# Patient Record
Sex: Female | Born: 1970 | Race: Black or African American | Hispanic: No | Marital: Single | Smoking: Never smoker
Health system: Southern US, Community
[De-identification: ages and names within clinical notes are randomized; demographics above are authoritative.]

## PROBLEM LIST (undated history)

## (undated) DIAGNOSIS — E119 Type 2 diabetes mellitus without complications: Secondary | ICD-10-CM

## (undated) DIAGNOSIS — K760 Fatty (change of) liver, not elsewhere classified: Principal | ICD-10-CM

## (undated) DIAGNOSIS — K8020 Calculus of gallbladder without cholecystitis without obstruction: Secondary | ICD-10-CM

## (undated) DIAGNOSIS — Z8619 Personal history of other infectious and parasitic diseases: Secondary | ICD-10-CM

## (undated) DIAGNOSIS — I10 Essential (primary) hypertension: Secondary | ICD-10-CM

## (undated) HISTORY — DX: Fatty (change of) liver, not elsewhere classified: K76.0

## (undated) HISTORY — PX: REDUCTION MAMMAPLASTY: SUR839

## (undated) HISTORY — DX: Personal history of other infectious and parasitic diseases: Z86.19

## (undated) HISTORY — DX: Calculus of gallbladder without cholecystitis without obstruction: K80.20

## (undated) HISTORY — DX: Essential (primary) hypertension: I10

## (undated) HISTORY — PX: PARTIAL HYSTERECTOMY: SHX80

---

## 2013-02-11 ENCOUNTER — Emergency Department (HOSPITAL_BASED_OUTPATIENT_CLINIC_OR_DEPARTMENT_OTHER)
Admission: EM | Admit: 2013-02-11 | Discharge: 2013-02-11 | Disposition: A | Discharge status: Home / Self Care | Payer: BC Managed Care – PPO | Attending: Emergency Medicine | Admitting: Emergency Medicine

## 2013-02-11 ENCOUNTER — Encounter (HOSPITAL_BASED_OUTPATIENT_CLINIC_OR_DEPARTMENT_OTHER): Payer: Self-pay | Admitting: Emergency Medicine

## 2013-02-11 ENCOUNTER — Inpatient Hospital Stay (HOSPITAL_BASED_OUTPATIENT_CLINIC_OR_DEPARTMENT_OTHER): Admit: 2013-02-11 | Payer: BC Managed Care – PPO

## 2013-02-11 DIAGNOSIS — Z794 Long term (current) use of insulin: Secondary | ICD-10-CM | POA: Insufficient documentation

## 2013-02-11 DIAGNOSIS — N898 Other specified noninflammatory disorders of vagina: Secondary | ICD-10-CM | POA: Insufficient documentation

## 2013-02-11 DIAGNOSIS — N760 Acute vaginitis: Secondary | ICD-10-CM

## 2013-02-11 DIAGNOSIS — N949 Unspecified condition associated with female genital organs and menstrual cycle: Secondary | ICD-10-CM | POA: Insufficient documentation

## 2013-02-11 DIAGNOSIS — Z90711 Acquired absence of uterus with remaining cervical stump: Secondary | ICD-10-CM | POA: Insufficient documentation

## 2013-02-11 DIAGNOSIS — R102 Pelvic and perineal pain: Secondary | ICD-10-CM

## 2013-02-11 DIAGNOSIS — Z3202 Encounter for pregnancy test, result negative: Secondary | ICD-10-CM | POA: Insufficient documentation

## 2013-02-11 DIAGNOSIS — E119 Type 2 diabetes mellitus without complications: Secondary | ICD-10-CM | POA: Insufficient documentation

## 2013-02-11 HISTORY — DX: Type 2 diabetes mellitus without complications: E11.9

## 2013-02-11 LAB — COMPREHENSIVE METABOLIC PANEL
AST: 25 U/L (ref 0–37)
Albumin: 3.9 g/dL (ref 3.5–5.2)
Calcium: 10.1 mg/dL (ref 8.4–10.5)
Creatinine, Ser: 0.6 mg/dL (ref 0.50–1.10)
Total Protein: 8 g/dL (ref 6.0–8.3)

## 2013-02-11 LAB — URINALYSIS, ROUTINE W REFLEX MICROSCOPIC
Glucose, UA: NEGATIVE mg/dL
Ketones, ur: NEGATIVE mg/dL
Leukocytes, UA: NEGATIVE
pH: 6.5 (ref 5.0–8.0)

## 2013-02-11 LAB — WET PREP, GENITAL: Yeast Wet Prep HPF POC: NONE SEEN

## 2013-02-11 LAB — CBC
MCH: 31.8 pg (ref 26.0–34.0)
MCV: 93 fL (ref 78.0–100.0)
Platelets: 282 10*3/uL (ref 150–400)
RDW: 12.8 % (ref 11.5–15.5)
WBC: 11.2 10*3/uL — ABNORMAL HIGH (ref 4.0–10.5)

## 2013-02-11 MED ORDER — METRONIDAZOLE 500 MG PO TABS: 500.0000 mg | ORAL_TABLET | Freq: Two times a day (BID) | ORAL | Status: DC

## 2013-02-11 MED ORDER — IBUPROFEN 600 MG PO TABS: 600.0000 mg | ORAL_TABLET | Freq: Four times a day (QID) | ORAL | Status: DC | PRN

## 2013-02-11 NOTE — ED Notes (Signed)
Pelvic cart set up at bedside  

## 2013-02-11 NOTE — ED Notes (Signed)
Pt reports abd pain, worse on right side, but feels "down below, going across" - starting yesterday, yellowish vaginal discharge with strange odor, starting today. No fevers, no n/v.

## 2013-02-11 NOTE — ED Provider Notes (Signed)
History     CSN: [id number]  Arrival date & time 02/11/13  0022   First MD Initiated Contact with Patient 02/11/13 0148      Chief Complaint  Patient presents with  . Abdominal Pain  . Vaginal Discharge    (Consider location/radiation/quality/duration/timing/severity/associated sxs/prior treatment) HPI Pt presenting with concern for vaginal discharge and well as right sided pelvic pain.  Pt states the pain began yesterday, she noted the discharge today.  Pain has been constant, is sharp in nature.  Certain positions make pain worse. No dysuria, no fever/chills, no vomiting, has had normal appetite.  Denies change in bowel movement.  Discharge described as yellowish with a foul odor.  No rash.  She has not had any treatment for her symptoms.  There are no other associated systemic symptoms, there are no other alleviating or modifying factors.   Past Medical History  Diagnosis Date  . Diabetes mellitus without complication     Past Surgical History  Procedure Laterality Date  . Partial hysterectomy      No family history on file.  History  Substance Use Topics  . Smoking status: Not on file  . Smokeless tobacco: Not on file  . Alcohol Use: Yes     Comment: social    OB History   Grav Para Term Preterm Abortions TAB SAB Ect Mult Living                  Review of Systems ROS reviewed and all otherwise negative except for mentioned in HPI  Allergies  Review of patient's allergies indicates no known allergies.  Home Medications   Current Outpatient Rx  Name  Route  Sig  Dispense  Refill  . insulin glargine (LANTUS) 100 UNIT/ML injection   Subcutaneous   Inject 30 Units into the skin at bedtime.           BP 125/96  Pulse 79  Temp(Src) 98.2 F (36.8 C) (Oral)  Resp 16  Ht 5\' 1"  (1.549 m)  Wt 160 lb (72.576 kg)  BMI 30.25 kg/m2  SpO2 97% Vitals reviewed Physical Exam Physical Examination: General appearance - alert, well appearing, and in no  distress Mental status - alert, oriented to person, place, and time Eyes - no conjunctival injection, no scleral icterus Mouth - mucous membranes moist, pharynx normal without lesions Chest - clear to auscultation, no wheezes, rales or rhonchi, symmetric air entry Heart - normal rate, regular rhythm, normal S1, S2, no murmurs, rubs, clicks or gallops Abdomen - soft, nontender, nondistended, no masses or organomegaly Pelvic - normal external genitalia, vulva, vagina, cervical cuff, scant whitish discharge in vaginal vault, mild right adnexal tenderness, uterus surgically absent Extremities - peripheral pulses normal, no pedal edema, no clubbing or cyanosis Skin - normal coloration and turgor, no rashes  ED Course  Procedures (including critical care time)  Labs Reviewed  WET PREP, GENITAL - Abnormal; Notable for the following:    Clue Cells Wet Prep HPF POC FEW (*)    WBC, Wet Prep HPF POC RARE (*)    All other components within normal limits  URINALYSIS, ROUTINE W REFLEX MICROSCOPIC - Abnormal; Notable for the following:    APPearance CLOUDY (*)    All other components within normal limits  CBC - Abnormal; Notable for the following:    WBC 11.2 (*)    All other components within normal limits  COMPREHENSIVE METABOLIC PANEL - Abnormal; Notable for the following:    Glucose, Bld 148 (*)  ALT 43 (*)    Total Bilirubin 0.2 (*)    All other components within normal limits  GC/CHLAMYDIA PROBE AMP  PREGNANCY, URINE  LIPASE, BLOOD   No results found.   1. Pelvic pain       MDM  Pt presenting with right sided pelvic pain and vaginal discharge.  She is s/p hysterectomy but believes she still has both ovaries.  Pt with clue cells on wet prep- so given rx for flagyl to treat BV.  Pt scheduled for pelvic ultrasound- to return in a few hours once ultrasound capabilities are available at this facility.  I have low suspicion for ovarian torsion.  Abdomen is nontender, pain seems  localized to pelvic region, making appendicitis less likely.  Pt is agreeable with plan for return, she was given strict return precautions.  I do not think a CT scan will be helpful in this patient, if ultrasound is reassuring, she will arrange for f/u with her PMD or return to the ED with worsening symptoms.          Ethelda Chick, MD 02/11/13 [phone]

## 2013-02-15 ENCOUNTER — Other Ambulatory Visit (HOSPITAL_BASED_OUTPATIENT_CLINIC_OR_DEPARTMENT_OTHER): Payer: Self-pay | Admitting: Emergency Medicine

## 2013-02-15 DIAGNOSIS — R102 Pelvic and perineal pain: Secondary | ICD-10-CM

## 2013-02-21 ENCOUNTER — Ambulatory Visit: Payer: Self-pay | Admitting: Family

## 2013-02-28 ENCOUNTER — Ambulatory Visit (INDEPENDENT_AMBULATORY_CARE_PROVIDER_SITE_OTHER): Payer: BC Managed Care – PPO | Admitting: Family

## 2013-02-28 ENCOUNTER — Telehealth: Payer: Self-pay | Admitting: *Deleted

## 2013-02-28 ENCOUNTER — Encounter: Payer: Self-pay | Admitting: Family

## 2013-02-28 VITALS — BP 142/100 | HR 80 | Temp 97.8°F | Resp 16 | Ht 62.5 in | Wt 168.1 lb

## 2013-02-28 DIAGNOSIS — R059 Cough, unspecified: Secondary | ICD-10-CM

## 2013-02-28 DIAGNOSIS — J40 Bronchitis, not specified as acute or chronic: Secondary | ICD-10-CM

## 2013-02-28 DIAGNOSIS — E119 Type 2 diabetes mellitus without complications: Secondary | ICD-10-CM

## 2013-02-28 DIAGNOSIS — G609 Hereditary and idiopathic neuropathy, unspecified: Secondary | ICD-10-CM

## 2013-02-28 DIAGNOSIS — A6000 Herpesviral infection of urogenital system, unspecified: Secondary | ICD-10-CM

## 2013-02-28 DIAGNOSIS — R05 Cough: Secondary | ICD-10-CM

## 2013-02-28 DIAGNOSIS — Z23 Encounter for immunization: Secondary | ICD-10-CM

## 2013-02-28 DIAGNOSIS — E1165 Type 2 diabetes mellitus with hyperglycemia: Secondary | ICD-10-CM | POA: Insufficient documentation

## 2013-02-28 DIAGNOSIS — I10 Essential (primary) hypertension: Secondary | ICD-10-CM

## 2013-02-28 DIAGNOSIS — E785 Hyperlipidemia, unspecified: Secondary | ICD-10-CM

## 2013-02-28 DIAGNOSIS — G629 Polyneuropathy, unspecified: Secondary | ICD-10-CM | POA: Insufficient documentation

## 2013-02-28 MED ORDER — BLOOD GLUCOSE TEST VI STRP: ORAL_STRIP | Status: DC

## 2013-02-28 MED ORDER — VALACYCLOVIR HCL 1 G PO TABS: 1000.0000 mg | ORAL_TABLET | Freq: Every day | ORAL | Status: DC

## 2013-02-28 MED ORDER — LANCETS MISC: Status: DC

## 2013-02-28 MED ORDER — AZITHROMYCIN 250 MG PO TABS: ORAL_TABLET | ORAL | Status: DC

## 2013-02-28 NOTE — Assessment & Plan Note (Signed)
Will rx with zithromax for bronchitis.  If symptoms don't improve consider CXR and discontinuation of ACE.

## 2013-02-28 NOTE — Telephone Encounter (Signed)
Check sugar bid.

## 2013-02-28 NOTE — Assessment & Plan Note (Signed)
Will rx with zithromax.   

## 2013-02-28 NOTE — Telephone Encounter (Signed)
Rxs sent

## 2013-02-28 NOTE — Assessment & Plan Note (Signed)
She would like to try suppressive therapy with valtex. Will order.

## 2013-02-28 NOTE — Addendum Note (Signed)
Addended by: Mervin Kung A on: 02/28/2013 04:14 PM   Modules accepted: Orders

## 2013-02-28 NOTE — Assessment & Plan Note (Signed)
Per pt this is well controlled.  Obtain a1c, urine microalbumin, bmet.  Continue lantus. Handwritten rx for glucose meter provided. Will request old records.  Refer for DM eye exam.  Pneumovax today.

## 2013-02-28 NOTE — Progress Notes (Signed)
Subjective:    Patient ID: Breanna Avery, female    DOB: [birth year], 42 y.o.   MRN: [id number]  HPI  Breanna Avery is a 42 yr old female here today to establish care.  1) DM2-she is currently maintained on lantus 30 units HS. She was diagnosed 2 years ago.  She has been following with regional physicians on [place].  She reports that in the AM her sugar runs about 90.  HS sugars can go up to 140.  Last eye exam was may of last year.   2) HTN- Currently maintained on lisinopril.  Reports that she is on lisinopril x 6 mos. She is s/p hysterectomy.  3) Cough- reports that cough comes and goes.  Worse with deep breath.  She reports that his has been going on x 6 weeks.  She denies associated fever. Notes mild phlegm.  Feels bad.  She has not taken any otc meds.    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    Review of Systems  Constitutional: Negative for unexpected weight change.  HENT: Positive for rhinorrhea.   Eyes: Negative for visual disturbance.  Respiratory: Positive for cough.   Cardiovascular: Negative for leg swelling.  Gastrointestinal: Negative for nausea, vomiting and diarrhea.  Genitourinary: Negative for dysuria and frequency.  Musculoskeletal: Negative for arthralgias.       She reports some Leg soreness which she attributes to walking at work.   Skin:       Intermittent HSV rash  Neurological: Negative for headaches.  Hematological: Negative for adenopathy.  Psychiatric/Behavioral:       Denies depression/anxiety   Past Medical History  Diagnosis Date  . Diabetes mellitus without complication   . Hypertension   . History of herpes genitalis   . HTN (hypertension)     History   Social History  . Marital Status: Single    Spouse Name: N/A    Number of Children: 3  . Years of Education: N/A   Occupational History  .     Social History Main Topics  . Smoking status: Never Smoker   .  Smokeless tobacco: Never Used  . Alcohol Use: Yes     Comment: social  . Drug Use: Not on file  . Sexually Active: Not on file   Other Topics Concern  . 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 Partial hysterectomy      Family History  Problem Relation Age of Onset  . Hypertension Mother   . Diabetes Father   . Hypertension Sister   . Cancer Maternal Aunt     ovarian  . Cancer Maternal Uncle     lung  . Diabetes Maternal Grandmother   . Diabetes Maternal Grandfather   . Heart disease Neg Hx   . Diabetes Cousin     No Known Allergies  Current Outpatient Prescriptions on File Prior to Visit  Medication Sig Dispense Refill  . insulin glargine (LANTUS) 100 UNIT/ML injection Inject 30 Units into the skin at bedtime.      . metroNIDAZOLE (FLAGYL) 500 MG tablet Take 1 tablet (500 mg  total) by mouth 2 (two) times daily.  10 tablet  0  . ibuprofen (ADVIL,MOTRIN) 600 MG tablet Take 1 tablet (600 mg total) by mouth every 6 (six) hours as needed for pain.  30 tablet  0   No current facility-administered medications on file prior to visit.    BP 142/100  Pulse 80  Temp(Src) 97.8 F (36.6 C) (Oral)  Resp 16  Ht 5' 2.5" (1.588 m)  Wt 168 lb 1.9 oz (76.259 kg)  BMI 30.24 kg/m2  SpO2 99%       Objective:   Physical Exam  Constitutional: She is oriented to person, place, and time. She appears well-developed and well-nourished. No distress.  HENT:  Head: Normocephalic and atraumatic.  Cardiovascular: Normal rate and regular rhythm.   No murmur heard. Pulmonary/Chest: Effort normal and breath sounds normal. No respiratory distress. She has no wheezes. She has no rales. She exhibits no tenderness.  Musculoskeletal: She  exhibits no edema.  Lymphadenopathy:    She has no cervical adenopathy.  Neurological: She is alert and oriented to person, place, and time.  Skin: Skin is warm and dry. No rash noted. No erythema. No pallor.  Psychiatric: She has a normal mood and affect. Her behavior is normal. Judgment and thought content normal.          Assessment & Plan:

## 2013-02-28 NOTE — Assessment & Plan Note (Signed)
BP is up today.  Reports that she did not take lisinopril. Recommended that she resume med. Will repeat BP in 1 month.

## 2013-02-28 NOTE — Assessment & Plan Note (Signed)
Stable on gabapentin, continue same.  

## 2013-02-28 NOTE — Telephone Encounter (Signed)
Received call from [facility] requesting rx for test strips and lancets. Will send rx for generic strips and lancets as they are not sure which meter pt is going to obtain. Please advise how many times a day you would like pt to check sugars?

## 2013-02-28 NOTE — Patient Instructions (Addendum)
Please complete lab work prior to leaving. Call if cough worsens or if not improved in 2-3 days. Follow up in 1 month for a completed physical. Welcome to [facility]!

## 2013-02-28 NOTE — Assessment & Plan Note (Signed)
Notes some myalgia, not sure if this is related to statin. Check CK, recommended pt stop statin x 1 week and let us know how she feels off of the medication.

## 2013-03-01 LAB — HEPATIC FUNCTION PANEL
ALT: 52 U/L — ABNORMAL HIGH (ref 0–35)
AST: 38 U/L — ABNORMAL HIGH (ref 0–37)
Bilirubin, Direct: <0.1 mg/dL (ref 0.0–0.3)
Total Protein: 7.5 g/dL (ref 6.0–8.3)

## 2013-03-01 LAB — BASIC METABOLIC PANEL
BUN: 6 mg/dL (ref 6–23)
Calcium: 10 mg/dL (ref 8.4–10.5)
Glucose, Bld: 136 mg/dL — ABNORMAL HIGH (ref 70–99)
Sodium: 140 mEq/L (ref 135–145)

## 2013-03-01 LAB — LIPID PANEL
Cholesterol: 245 mg/dL — ABNORMAL HIGH (ref 0–200)
HDL: 27 mg/dL — ABNORMAL LOW (ref >39)
Total CHOL/HDL Ratio: 9.1 Ratio
Triglycerides: 212 mg/dL — ABNORMAL HIGH (ref <150)
VLDL: 42 mg/dL — ABNORMAL HIGH (ref 0–40)

## 2013-03-01 LAB — CK: Total CK: 162 U/L (ref 7–177)

## 2013-03-01 LAB — HEMOGLOBIN A1C
Hgb A1c MFr Bld: 8.1 % — ABNORMAL HIGH (ref <5.7)
Mean Plasma Glucose: 186 mg/dL — ABNORMAL HIGH (ref <117)

## 2013-03-04 ENCOUNTER — Telehealth: Payer: Self-pay | Admitting: Family

## 2013-03-04 MED ORDER — SITAGLIPTIN PHOSPHATE 100 MG PO TABS: 100.0000 mg | ORAL_TABLET | Freq: Every day | ORAL | Status: DC

## 2013-03-04 NOTE — Telephone Encounter (Signed)
Pls call pt and let her know A1C 8.1 above goal.  I would like her to continue lantus insulin, add januvia once daily (we may have some coupons).  Cholesterol is high.  Is she taking lipitor? If not, she should start.  If she is taking regularly, should be increased to 80mg  once daily #30, 2 refills. Liver test is elevated.  Likely due to fatty liver.  Recommend low fat/low cholesterol diet.

## 2013-03-07 ENCOUNTER — Telehealth: Payer: Self-pay | Admitting: Family

## 2013-03-07 NOTE — Telephone Encounter (Signed)
januvia 100 mg tablet take 1 tablet by mouth every day qty 30

## 2013-03-07 NOTE — Telephone Encounter (Signed)
Left message on voicemail to return my call.  

## 2013-03-07 NOTE — Telephone Encounter (Signed)
Notified pt and she voices understanding. 30 day free trial offer and copay discount card left at front desk for pt to pick up.

## 2013-03-07 NOTE — Telephone Encounter (Signed)
See 03/04/13 phone note.

## 2013-03-14 ENCOUNTER — Telehealth: Payer: Self-pay | Admitting: Family

## 2013-03-14 NOTE — Telephone Encounter (Signed)
Received medical records from [facility]  P: [phone] F: [phone]

## 2013-03-20 ENCOUNTER — Telehealth: Payer: Self-pay | Admitting: Family

## 2013-03-20 NOTE — Telephone Encounter (Signed)
Breanna Avery at [facility] [phone] says they will send the case to the insurance company.  We will receive a decision with 72 hours by fax

## 2013-03-24 MED ORDER — METFORMIN HCL 500 MG PO TABS: 500.0000 mg | ORAL_TABLET | Freq: Two times a day (BID) | ORAL | Status: DC

## 2013-03-24 NOTE — Telephone Encounter (Signed)
Received denial from [facility] for Januvia because pt has not tried and failed metformin. Please advise.

## 2013-03-24 NOTE — Telephone Encounter (Signed)
OK, instead of januvia, lets have her start metformin. Rx sent.

## 2013-03-27 NOTE — Telephone Encounter (Signed)
.  left message to have patient return my call.  

## 2013-03-28 ENCOUNTER — Telehealth: Payer: Self-pay | Admitting: *Deleted

## 2013-03-28 NOTE — Telephone Encounter (Signed)
Patient informed, understood & agreed/SLS  

## 2013-03-28 NOTE — Telephone Encounter (Signed)
LMOM with contact name and number for return call RE: scheduling appointment for A&E per provider instructions/SLS

## 2013-03-28 NOTE — Telephone Encounter (Signed)
She needs to be re-evaluated in the office please.

## 2013-03-28 NOTE — Telephone Encounter (Signed)
Pt reports that she has finished the ABX given at 05.20.14 OV for bronchitis about a week ago and is still experiencing coughing episodes that cause SOB & chest tightness, producing "yellowish" phlegm; was instructed to call office if no improvement with Zpack.  Pt would also like to inform you that she had a "spell yesterday w/faintness", she check her blood sugar & result was 40, today was 105 and she is asymptomatic/SLS Please advise.

## 2013-04-11 ENCOUNTER — Ambulatory Visit (INDEPENDENT_AMBULATORY_CARE_PROVIDER_SITE_OTHER): Payer: BC Managed Care – PPO | Admitting: Family

## 2013-04-11 ENCOUNTER — Telehealth: Payer: Self-pay | Admitting: Family

## 2013-04-11 ENCOUNTER — Other Ambulatory Visit: Payer: Self-pay | Admitting: Family

## 2013-04-11 ENCOUNTER — Encounter: Payer: Self-pay | Admitting: Family

## 2013-04-11 VITALS — BP 112/78 | HR 75 | Temp 97.8°F | Resp 16 | Ht 62.5 in | Wt 165.0 lb

## 2013-04-11 DIAGNOSIS — Z1231 Encounter for screening mammogram for malignant neoplasm of breast: Secondary | ICD-10-CM

## 2013-04-11 DIAGNOSIS — E785 Hyperlipidemia, unspecified: Secondary | ICD-10-CM

## 2013-04-11 DIAGNOSIS — R10816 Epigastric abdominal tenderness: Secondary | ICD-10-CM | POA: Insufficient documentation

## 2013-04-11 DIAGNOSIS — R1013 Epigastric pain: Secondary | ICD-10-CM

## 2013-04-11 DIAGNOSIS — Z23 Encounter for immunization: Secondary | ICD-10-CM

## 2013-04-11 DIAGNOSIS — A6000 Herpesviral infection of urogenital system, unspecified: Secondary | ICD-10-CM

## 2013-04-11 DIAGNOSIS — Z0000 Encounter for general adult medical examination without abnormal findings: Secondary | ICD-10-CM | POA: Insufficient documentation

## 2013-04-11 DIAGNOSIS — I10 Essential (primary) hypertension: Secondary | ICD-10-CM

## 2013-04-11 LAB — CBC WITH DIFFERENTIAL/PLATELET
Basophils Absolute: 0 10*3/uL (ref 0.0–0.1)
HCT: 35.2 % — ABNORMAL LOW (ref 36.0–46.0)
Lymphocytes Relative: 45 % (ref 12–46)
Lymphs Abs: 4.2 10*3/uL — ABNORMAL HIGH (ref 0.7–4.0)
Neutro Abs: 4.4 10*3/uL (ref 1.7–7.7)
Platelets: 338 10*3/uL (ref 150–400)
RBC: 3.95 MIL/uL (ref 3.87–5.11)
RDW: 14.2 % (ref 11.5–15.5)
WBC: 9.2 10*3/uL (ref 4.0–10.5)

## 2013-04-11 LAB — HEPATIC FUNCTION PANEL
Bilirubin, Direct: <0.1 mg/dL (ref 0.0–0.3)
Total Bilirubin: 0.4 mg/dL (ref 0.3–1.2)

## 2013-04-11 LAB — LIPASE: Lipase: 20 U/L (ref 0–75)

## 2013-04-11 MED ORDER — OMEPRAZOLE 40 MG PO CPDR: 40.0000 mg | DELAYED_RELEASE_CAPSULE | Freq: Every day | ORAL | Status: DC

## 2013-04-11 MED ORDER — PITAVASTATIN CALCIUM 4 MG PO TABS: 1.0000 | ORAL_TABLET | Freq: Every day | ORAL | Status: DC

## 2013-04-11 NOTE — Progress Notes (Signed)
Subjective:    Patient ID: Breanna Avery, female    DOB: May 28, 1971, 42 y.o.   MRN: [id number]  HPI  Patient presents today for complete physical.  Immunizations: due for Tdap Diet:reports healthy diet.   Exercise: reports walks 3 days a week for 25-30 minutes Pap Smear:  Unsure of last pap smear. Mammogram: due  Pt here for fasting physical. Doesn't remember last tetanus. Last mammogram 02/2012, thinks she had a pap smear last year. Has not had EKG within the last year.  HTN- reports that she has resumed her BP med.  Genital herpes- No outbreaks on valtrex.   Hyperlipidemia- reports that she stopped atorvastatin briefly and myalgias improved.   Now back on statin, only able to tolerate 20mg  of atorvastatin.  GI upset- reports some GERD symptoms requesting PPI.     Review of Systems  Constitutional: Negative for unexpected weight change.  HENT: Negative for congestion.   Respiratory:       Reports that she has been using inhaler which has been helping with breathing  Cardiovascular: Negative for chest pain.  Gastrointestinal:       Reports mild reflux symptoms  Genitourinary: Negative for dysuria and frequency.  Musculoskeletal: Negative for myalgias and arthralgias.  Skin: Negative for rash.  Neurological: Negative for headaches.  Hematological: Negative for adenopathy.  Psychiatric/Behavioral:       Denies depression/anxiety   Past Medical History  Diagnosis Date  . Diabetes mellitus without complication   . Hypertension   . History of herpes genitalis   . HTN (hypertension)     History   Social History  . Marital Status: Single    Spouse Name: N/A    Number of Children: 3  . Years of Education: N/A   Occupational History  .     Social History Main Topics  . Smoking status: Never Smoker   . Smokeless tobacco: Never Used  . Alcohol Use: Yes     Comment: social  . Drug Use: Not on file  . Sexually Active: Not on file   Other Topics Concern  . 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 Partial hysterectomy      Family History  Problem Relation Age of Onset  . Hypertension Mother   . Diabetes Father   . Hypertension Sister   . Cancer Maternal Aunt     ovarian  . Cancer Maternal Uncle     lung  . Diabetes Maternal Grandmother   . Diabetes Maternal Grandfather   . Heart disease Neg Hx   . Diabetes Cousin     No Known Allergies  Current Outpatient Prescriptions on File Prior to Visit  Medication Sig Dispense Refill  . aspirin EC 81 MG tablet Take 81 mg by mouth daily.      . B-D ULTRAFINE III SHORT PEN 31G X 8 MM MISC       . gabapentin (NEURONTIN) 300 MG capsule 300 mg. One caps by mouth every morning and 2 caps at bedtime.      . Glucose Blood (BLOOD GLUCOSE TEST STRIPS) STRP Use to check blood sugar twice a day. DX 250.00  100 each  2  . ibuprofen (ADVIL,MOTRIN) 600 MG tablet Take 1 tablet (600 mg total) by mouth every 6 (six) hours as needed for pain.  30 tablet  0  . insulin glargine (LANTUS) 100 UNIT/ML injection Inject 30 Units into the skin at bedtime.      . Lancets MISC Use to check blood sugar twice a day.  DX 250.00  100 each  2  . lisinopril (PRINIVIL,ZESTRIL) 10 MG tablet Take 10 mg by mouth daily.      . metFORMIN (GLUCOPHAGE) 500 MG tablet Take 1 tablet (500 mg total) by mouth 2 (two) times daily with a meal.  60 tablet  3  . valACYclovir (VALTREX) 1000 MG tablet Take 1 tablet (1,000 mg total) by mouth daily.  30 tablet  5   No current facility-administered medications on file prior to visit.    BP 112/78  Pulse 75  Temp(Src) 97.8 F (36.6 C) (Oral)  Resp 16  Ht 5' 2.5" (1.588 m)  Wt 165 lb (74.844 kg)  BMI 29.68 kg/m2  SpO2 99%         Objective:   Physical Exam  Physical Exam  Constitutional: She is oriented to person, place, and time. She appears well-developed and well-nourished. No distress.  HENT:  Head: Normocephalic and atraumatic.  Right Ear: Tympanic membrane and ear canal normal.  Left Ear: Tympanic membrane and ear canal normal.  Mouth/Throat: Oropharynx is clear and moist.  Eyes: Pupils are equal, round, and reactive to light. No scleral icterus.  Neck: Normal range of motion. No thyromegaly present.  Cardiovascular: Normal rate and regular rhythm.   No murmur heard. Pulmonary/Chest: Effort normal and breath sounds normal. No respiratory distress. He has no wheezes. She has no rales. She exhibits no tenderness.  Abdominal: Soft. Bowel sounds are normal. He exhibits no distension and no mass. There is no tenderness. There is no rebound and no guarding.  Musculoskeletal: She exhibits no edema.  Lymphadenopathy:    She has no cervical adenopathy.  Neurological: She is alert and oriented to person, place, and time. She has normal reflexes. She exhibits normal muscle tone. Coordination normal.  Skin: Skin is warm and dry.  Psychiatric: She has a normal mood and affect. Her behavior is normal. Judgment and thought content normal.  Breasts: Examined lying Right: Without masses, retractions, discharge or axillary adenopathy.  Left: Without masses, retractions, discharge or axillary adenopathy.  Inguinal/mons: Normal without inguinal adenopathy  External genitalia: Normal  BUS/Urethra/Skene's glands: Normal  Bladder: Normal  Vagina: Normal  Pelvic: uterus is surgically absent, no adnexal masses.          Assessment & Plan:        Assessment & Plan:

## 2013-04-11 NOTE — Patient Instructions (Addendum)
Please complete your lab work prior to leaving. Schedule mammogram on the first floor. Call if abdominal tenderness worsens or if it is not improved by the omeprazole.   Stop atorvastatin, start Livalo- call if muscle pains persist after you switch to Livalo.

## 2013-04-11 NOTE — Assessment & Plan Note (Signed)
Mild. Will give trial of PPI.  Obtain lipase, lft. Call if symptoms worsen or if not improved in 1-2 weeks.

## 2013-04-11 NOTE — Telephone Encounter (Signed)
Prior authorization initiated for livalo 4mg  tablets with [employer] at [facility].  They will advise decision with 24 hours  Case # [id number]

## 2013-04-11 NOTE — Assessment & Plan Note (Signed)
Continue healthy diet, exercise. Tdap today.  Mammogram today. Discussed BSE.

## 2013-04-11 NOTE — Assessment & Plan Note (Signed)
Switch atorvastatin to livalo to see is she is better able to tolerate.

## 2013-04-11 NOTE — Assessment & Plan Note (Signed)
No outbreaks since starting valtrex.

## 2013-04-11 NOTE — Assessment & Plan Note (Signed)
BP looks good now that she is back on lisinopril. Continue same.

## 2013-04-12 ENCOUNTER — Ambulatory Visit (HOSPITAL_BASED_OUTPATIENT_CLINIC_OR_DEPARTMENT_OTHER): Payer: BC Managed Care – PPO

## 2013-04-13 MED ORDER — ROSUVASTATIN CALCIUM 20 MG PO TABS: 20.0000 mg | ORAL_TABLET | Freq: Every day | ORAL | Status: DC

## 2013-04-13 NOTE — Telephone Encounter (Signed)
I would switch her to Crestor 20 mg daily. It is water soluble so she should tolerate better. Samples are on her desk to try. On prescription write that she failed Lipitor and simvastatin and that will help with prior auth. Disp #30 with 3 rf

## 2013-04-13 NOTE — Telephone Encounter (Signed)
Received denial from [facility] for Livalo due to "60 day trials of simvastatin, atorvastatin and at least crestor did not lower cholesterol enough". Appeal can be requested. Left message for pt to return my call.  Need to know any/all cholesterol medications tried and reason for failure.

## 2013-04-13 NOTE — Telephone Encounter (Signed)
Left detailed message on voicemail re: samples to pick up Monday. Call if she tolerates medication without side effects and we can send further refills. Samples given to pt.

## 2013-04-13 NOTE — Telephone Encounter (Signed)
Spoke with pt. She states that she is not able to take more than 20mg  atorvastatin due to myalgia. Pt has also tried simvastatin in the past. Reports that her previous doctor did not feel that it was controlling her cholesterol enough but pt reports that she was not compliant with the medication. Notes that she did not have myalgias while on simvastatin and is ok to try it again. Per ins. we can try alternative (simvastatin or Crestor) or send letter of appeal. Please advise.

## 2013-04-17 ENCOUNTER — Telehealth: Payer: Self-pay | Admitting: Family

## 2013-04-17 NOTE — Telephone Encounter (Signed)
Opened in error

## 2013-04-19 ENCOUNTER — Inpatient Hospital Stay (HOSPITAL_BASED_OUTPATIENT_CLINIC_OR_DEPARTMENT_OTHER): Admission: RE | Admit: 2013-04-19 | Payer: BC Managed Care – PPO | Source: Ambulatory Visit

## 2013-05-01 ENCOUNTER — Ambulatory Visit (HOSPITAL_BASED_OUTPATIENT_CLINIC_OR_DEPARTMENT_OTHER)
Admission: RE | Admit: 2013-05-01 | Discharge: 2013-05-01 | Disposition: A | Discharge status: Home / Self Care | Payer: BC Managed Care – PPO | Source: Ambulatory Visit | Attending: Family | Admitting: Family

## 2013-05-01 ENCOUNTER — Encounter: Payer: Self-pay | Admitting: Family

## 2013-05-01 ENCOUNTER — Ambulatory Visit (INDEPENDENT_AMBULATORY_CARE_PROVIDER_SITE_OTHER): Payer: BC Managed Care – PPO | Admitting: Family

## 2013-05-01 VITALS — BP 130/94 | HR 66 | Temp 98.1°F | Resp 16 | Ht 62.5 in | Wt 163.0 lb

## 2013-05-01 DIAGNOSIS — L918 Other hypertrophic disorders of the skin: Secondary | ICD-10-CM | POA: Insufficient documentation

## 2013-05-01 DIAGNOSIS — L909 Atrophic disorder of skin, unspecified: Secondary | ICD-10-CM

## 2013-05-01 DIAGNOSIS — Z1231 Encounter for screening mammogram for malignant neoplasm of breast: Secondary | ICD-10-CM | POA: Insufficient documentation

## 2013-05-01 NOTE — Patient Instructions (Addendum)
Please keep treament areas clean and dry x 24 hours. Call if you develop redness/swelling drainage. You may use tylenol as needed for pain today.

## 2013-05-01 NOTE — Assessment & Plan Note (Signed)
Procedure including risks/benefits explained to patient.  Questions were answered. After informed consent was obtained, sites were cleansed with betadine and then alcohol. 1% Lidocaine with epinephrine was injected under lesion and then skin tags were removed using blade. 7 were removed from neck and one from beneath breast.  Several sites required a small amount of cautery obtain hemostasis.  Pt tolerated procedure well.  Specimen sent for pathology review.  Pt instructed to keep the area dry for 24 hours and to contact us if he develops redness, drainage or swelling at the site.  Pt may use tylenol as needed for discomfort today.

## 2013-05-01 NOTE — Progress Notes (Signed)
  Subjective:    Patient ID: Breanna Avery, female    DOB: [birth year], 42 y.o.   MRN: [id number]  HPI  Breanna Avery is a 42 yr old female who presents today for removal of multiple skin tags from her neck.  She also has one beneath her right breast.    Review of Systems     Objective:   Physical Exam  Constitutional: She appears well-developed and well-nourished.  Skin:  Multiple skin tags noted anterior/posterior neck.  + skin tag beneath right breast.  Psychiatric: She has a normal mood and affect. Her behavior is normal. Judgment and thought content normal.          Assessment & Plan:

## 2013-06-03 ENCOUNTER — Other Ambulatory Visit: Payer: Self-pay | Admitting: Family

## 2013-07-17 ENCOUNTER — Ambulatory Visit (INDEPENDENT_AMBULATORY_CARE_PROVIDER_SITE_OTHER): Payer: BC Managed Care – PPO | Admitting: Family

## 2013-07-17 ENCOUNTER — Encounter: Payer: Self-pay | Admitting: Family

## 2013-07-17 VITALS — BP 142/88 | HR 67 | Temp 97.7°F | Resp 16 | Ht 62.5 in | Wt 167.1 lb

## 2013-07-17 DIAGNOSIS — E785 Hyperlipidemia, unspecified: Secondary | ICD-10-CM

## 2013-07-17 DIAGNOSIS — E119 Type 2 diabetes mellitus without complications: Secondary | ICD-10-CM

## 2013-07-17 DIAGNOSIS — Z23 Encounter for immunization: Secondary | ICD-10-CM

## 2013-07-17 DIAGNOSIS — A6000 Herpesviral infection of urogenital system, unspecified: Secondary | ICD-10-CM

## 2013-07-17 DIAGNOSIS — I10 Essential (primary) hypertension: Secondary | ICD-10-CM

## 2013-07-17 DIAGNOSIS — G629 Polyneuropathy, unspecified: Secondary | ICD-10-CM

## 2013-07-17 DIAGNOSIS — E1149 Type 2 diabetes mellitus with other diabetic neurological complication: Secondary | ICD-10-CM

## 2013-07-17 DIAGNOSIS — G609 Hereditary and idiopathic neuropathy, unspecified: Secondary | ICD-10-CM

## 2013-07-17 LAB — BASIC METABOLIC PANEL WITH GFR
Calcium: 9.7 mg/dL (ref 8.4–10.5)
Chloride: 101 mEq/L (ref 96–112)
Creat: 0.68 mg/dL (ref 0.50–1.10)
GFR, Est Non African American: >89 mL/min

## 2013-07-17 LAB — LIPID PANEL
Cholesterol: 202 mg/dL — ABNORMAL HIGH (ref 0–200)
LDL Cholesterol: 120 mg/dL — ABNORMAL HIGH (ref 0–99)
Total CHOL/HDL Ratio: 6.7 Ratio
Triglycerides: 260 mg/dL — ABNORMAL HIGH (ref <150)

## 2013-07-17 LAB — HEMOGLOBIN A1C: Hgb A1c MFr Bld: 8.6 % — ABNORMAL HIGH (ref <5.7)

## 2013-07-17 MED ORDER — INSULIN GLARGINE 100 UNIT/ML SOLOSTAR PEN: PEN_INJECTOR | SUBCUTANEOUS | Status: DC

## 2013-07-17 NOTE — Assessment & Plan Note (Signed)
Reports small outbreak 2 weeks ago when patient missed a few doses.  Patient resumed medication and has had no outbreaks.  Continue Valtrex.

## 2013-07-17 NOTE — Progress Notes (Signed)
Subjective:    Patient ID: Breanna Avery, female    DOB: [birth year], 42 y.o.   MRN: [id number]  HPI Breanna Avery is a 42 y/o female who presents today for three month follow up of multiple problems.  1) Diabetes: Doesn't take Metformin routinely because she reports it drops her blood sugar (patient feels shakey) and causes diarrhea. Patient has been feeling this way since she began Metformin.  Taking Lantus daily at bedtime. Patient checking sugars at home reports they are 80 to 90. Last eye exam unknown.  2) Hypertension: Taking Lisinopril, denies dizziness, headaches, chest pain.  3) GI Upset: Taking Prilosec, denies epigastric irritation, continues to take Prilosec  4) Neuropathy: Taking Gabapentin, denies worsening of symptoms  5) Hyperlipidemia: Started on Lipitor reported muscle pain on 40mg , patient was encouraged to try samples of Crestor, patient never received message and has been taking 1/2 tablet of Lipitor without myalgias.  6) Genital Herpes: Small leison 2 weeks ago, patient missed a couple days of Valtrex, now back on Valtrex and lesion is gone.  7) Diet and Exercise:  Patient reports she eats fast food and fried and unhealthy foods. Denies exercise.    Review of Systems  Constitutional: Negative for activity change and fatigue.  HENT: Negative for ear pain, congestion and sore throat.   Respiratory: Negative for shortness of breath.   Cardiovascular: Negative for chest pain.  Gastrointestinal: Negative for vomiting, abdominal pain and diarrhea.  Endocrine: Negative for polydipsia, polyphagia and polyuria.  Genitourinary: Negative for dysuria.  Musculoskeletal: Negative for arthralgias.       Patient denies arthralgias since taking 1/2 tablet of Lipitor.   Skin: Negative for rash.  Neurological: Negative for headaches.  Psychiatric/Behavioral: The patient is nervous/anxious.        Patient reports increased stress at work and school over past 2 weeks.     Past  Medical History  Diagnosis Date  . Diabetes mellitus without complication   . Hypertension   . History of herpes genitalis   . HTN (hypertension)     History   Social History  . Marital Status: Single    Spouse Name: N/A    Number of Children: 3  . Years of Education: N/A   Occupational History  .     Social History Main Topics  . Smoking status: Never Smoker   . Smokeless tobacco: Never Used  . Alcohol Use: Yes     Comment: social  . Drug Use: Not on file  . Sexual Activity: Not on file   Other Topics Concern  . 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 Partial hysterectomy      Family History  Problem Relation Age of Onset  . Hypertension Mother   . Diabetes Father   . Hypertension Sister   . Cancer Maternal Aunt     ovarian  . Cancer Maternal Uncle     lung  . Diabetes Maternal Grandmother   . Diabetes Maternal Grandfather   . Heart disease Neg Hx   . Diabetes Cousin     No Known Allergies  Current Outpatient Prescriptions on File Prior to Visit  Medication Sig Dispense  Refill  . ACCU-CHEK FASTCLIX LANCETS MISC USE TO CHECK BLOOD SUGAR TWICE DAILY  102 each  1  . aspirin EC 81 MG tablet Take 81 mg by mouth daily.      . B-D ULTRAFINE III SHORT PEN 31G X 8 MM MISC       . gabapentin (NEURONTIN) 300 MG capsule 300 mg. One caps by mouth every morning and 2 caps at bedtime.      . Glucose Blood (BLOOD GLUCOSE TEST STRIPS) STRP Use to check blood sugar twice a day. DX 250.00  100 each  2  . lisinopril (PRINIVIL,ZESTRIL) 10 MG tablet Take 10 mg by mouth daily.      . metFORMIN (GLUCOPHAGE) 500 MG tablet Take 1 tablet (500 mg total) by mouth 2 (two) times daily with a meal.  60 tablet  3  . omeprazole  (PRILOSEC) 40 MG capsule Take 1 capsule (40 mg total) by mouth daily.  30 capsule  3  . rosuvastatin (CRESTOR) 20 MG tablet Take 1 tablet (20 mg total) by mouth daily. LOT [id number]  Exp 05/2015  28 tablet  0  . valACYclovir (VALTREX) 1000 MG tablet Take 1 tablet (1,000 mg total) by mouth daily.  30 tablet  5  . zolpidem (AMBIEN) 5 MG tablet Take 5 mg by mouth at bedtime as needed.       No current facility-administered medications on file prior to visit.    BP 142/88  Pulse 67  Temp(Src) 97.7 F (36.5 C) (Oral)  Resp 16  Ht 5' 2.5" (1.588 m)  Wt 167 lb 1.3 oz (75.787 kg)  BMI 30.05 kg/m2  SpO2 99%        Objective:   Physical Exam  Constitutional: She is oriented to person, place, and time. She appears well-nourished.  Neck: Neck supple.  Cardiovascular: Normal rate, regular rhythm and normal heart sounds.   Pulmonary/Chest: Breath sounds normal. No respiratory distress. She has no wheezes.  Abdominal: Soft. Bowel sounds are normal. There is no tenderness.  Musculoskeletal: She exhibits no tenderness.  Lymphadenopathy:    She has no cervical adenopathy.  Neurological: She is alert and oriented to person, place, and time.  Skin: Skin is warm and dry.  Psychiatric: She has a normal mood and affect. Her behavior is normal.          Assessment & Plan:

## 2013-07-17 NOTE — Assessment & Plan Note (Addendum)
Patient reports "shakey" feeling while taking Metformin and has not been taking routinely. Patient reports home blood sugars are running about 80 to 90.   D/C Metformin, continue Lantus. Diabetic foot exam negative. Counseled on diet and exercise. Check A1C, urine microalbumin, refer for dm eye exam.

## 2013-07-17 NOTE — Patient Instructions (Addendum)
Please complete your lab work prior to leaving. You will be contacted about your referral to the eye doctor. Follow up in 3 months.

## 2013-07-17 NOTE — Progress Notes (Signed)
  Subjective:    Patient ID: Breanna Avery, female    DOB: 06/24/1971, 42 y.o.   MRN: [id number]  HPI    Review of Systems     Objective:   Physical Exam        Assessment & Plan:  I have seen and examined Breanna Avery and agree with Breanna Avery assessment and plan.

## 2013-07-17 NOTE — Assessment & Plan Note (Signed)
Denies problems.  Continue Gabapentin.  Diabetic foot exam negative.

## 2013-07-17 NOTE — Assessment & Plan Note (Signed)
BP 142/88 in office today.  Continue taking Lisinopril.

## 2013-07-18 LAB — MICROALBUMIN / CREATININE URINE RATIO
Creatinine, Urine: 151.1 mg/dL
Microalb Creat Ratio: 5.2 mg/g (ref 0.0–30.0)
Microalb, Ur: 0.79 mg/dL (ref 0.00–1.89)

## 2013-07-21 ENCOUNTER — Telehealth: Payer: Self-pay | Admitting: Family

## 2013-07-21 MED ORDER — SITAGLIPTIN PHOSPHATE 100 MG PO TABS: 100.0000 mg | ORAL_TABLET | Freq: Every day | ORAL | Status: DC

## 2013-07-21 MED ORDER — EZETIMIBE 10 MG PO TABS: 10.0000 mg | ORAL_TABLET | Freq: Every day | ORAL | Status: DC

## 2013-07-21 NOTE — Telephone Encounter (Signed)
Please let pt know that her sugar is uncontrolled.  I would like her to continue lantus and add [place] once daily. Cholesterol is above goal.  Make sure she is taking crestor every night. Add zetia to help with cholesterol.

## 2013-07-24 NOTE — Telephone Encounter (Signed)
Pt also left message on voicemail that she was not feeling good and BS has been 400 for the last 3 days. Attempted to reach pt and left message to return my call.

## 2013-07-25 NOTE — Telephone Encounter (Signed)
Notified pt and she voices understanding. Pt states FBS today was 300. Advised her to proceed with recommendations below and let us know if BS is not improving over the next few days. Pt is agreeable to proceed with zetia. States she has not been on Crestor as she was still taking 1/2 tablet lipitor daily. Confirms that she has missed a couple days between doses of lipitor and asks that we just send Rx of Crestor to replace lipitor as she is not able to tolerate stronger dose. Is it ok to send rx of Crestor 20mg  daily and D/C lipitor?

## 2013-07-26 ENCOUNTER — Telehealth: Payer: Self-pay | Admitting: *Deleted

## 2013-07-26 NOTE — Telephone Encounter (Signed)
OK to stop lipitor and start crestor.  Call if muscle pain on crestor.

## 2013-07-26 NOTE — Telephone Encounter (Signed)
Received fax from [facility] pharmacy stating that "plan does not cover Januvia. Per Rx benefit plan alternative medications include: _________ [place] line]"; faxed request back to pharmacy & asked them to complete the blank for the plan alternative medications?/SLS

## 2013-07-31 MED ORDER — ROSUVASTATIN CALCIUM 20 MG PO TABS: 20.0000 mg | ORAL_TABLET | Freq: Every day | ORAL | Status: DC

## 2013-07-31 MED ORDER — INSULIN ASPART 100 UNIT/ML FLEXPEN: PEN_INJECTOR | SUBCUTANEOUS | Status: DC

## 2013-07-31 MED ORDER — SITAGLIPTIN PHOSPHATE 100 MG PO TABS: 100.0000 mg | ORAL_TABLET | Freq: Every day | ORAL | Status: DC

## 2013-07-31 NOTE — Telephone Encounter (Signed)
Spoke with Trula Ore at [facility] [phone]). She states Januvia is not covered on her plan and we will not be able to proceed with prior auth. She stated there were no preferred alternatives listed.  Was able to obtain authorization for Zetia from 07/31/13 through 07/31/14.  Pt left message that blood sugars are still running in the 300-400 range. Please advise.   Received fax from pharmacy on Friday that pt's insurance will not cover zetia or [place]. Left detailed message on pt's cell# that I will leave samples of januvia at the front desk for her to take while we try to obtain prior auth. And to let me know if she will be unable to pick up [place]. Have been unsuccessful at obtaining P.A.s due to long wait times with insurance. 2 boxes left at front desk.

## 2013-07-31 NOTE — Telephone Encounter (Signed)
Please call pt and advise her that instead of adding [place]- will add sliding scale novolog meal coverage TID AC meals. Continue lantus.  Rx has been sent to pharmacy.

## 2013-07-31 NOTE — Telephone Encounter (Signed)
Attempted to reach pt, left detailed message re: below instructions. Rx for novolog printed and was faxed to the pharmacy. Sent Crestor Rx previously and advised pt to call if any questions.

## 2013-08-04 ENCOUNTER — Ambulatory Visit: Payer: BC Managed Care – PPO | Admitting: Family

## 2013-08-04 DIAGNOSIS — Z0289 Encounter for other administrative examinations: Secondary | ICD-10-CM

## 2013-08-18 ENCOUNTER — Ambulatory Visit: Payer: BC Managed Care – PPO | Admitting: Family

## 2013-08-25 ENCOUNTER — Encounter: Payer: Self-pay | Admitting: Family

## 2013-08-25 ENCOUNTER — Ambulatory Visit (HOSPITAL_BASED_OUTPATIENT_CLINIC_OR_DEPARTMENT_OTHER)
Admission: RE | Admit: 2013-08-25 | Discharge: 2013-08-25 | Disposition: A | Discharge status: Home / Self Care | Payer: BC Managed Care – PPO | Source: Ambulatory Visit | Attending: Family | Admitting: Family

## 2013-08-25 ENCOUNTER — Ambulatory Visit (INDEPENDENT_AMBULATORY_CARE_PROVIDER_SITE_OTHER): Payer: BC Managed Care – PPO | Admitting: Family

## 2013-08-25 VITALS — BP 124/80 | HR 81 | Temp 98.4°F | Resp 16 | Ht 62.5 in | Wt 164.0 lb

## 2013-08-25 DIAGNOSIS — M25579 Pain in unspecified ankle and joints of unspecified foot: Secondary | ICD-10-CM | POA: Insufficient documentation

## 2013-08-25 DIAGNOSIS — M545 Low back pain, unspecified: Secondary | ICD-10-CM

## 2013-08-25 DIAGNOSIS — M25571 Pain in right ankle and joints of right foot: Secondary | ICD-10-CM

## 2013-08-25 DIAGNOSIS — E119 Type 2 diabetes mellitus without complications: Secondary | ICD-10-CM

## 2013-08-25 DIAGNOSIS — W19XXXA Unspecified fall, initial encounter: Secondary | ICD-10-CM | POA: Insufficient documentation

## 2013-08-25 DIAGNOSIS — M79604 Pain in right leg: Secondary | ICD-10-CM

## 2013-08-25 MED ORDER — MELOXICAM 7.5 MG PO TABS: 7.5000 mg | ORAL_TABLET | Freq: Every day | ORAL | Status: DC

## 2013-08-25 NOTE — Patient Instructions (Signed)
Please complete back and ankle xray on the first floor. Follow up in 2 month so we can keep track of your diabetes control.

## 2013-08-25 NOTE — Assessment & Plan Note (Signed)
Likely sprain, right foot.  X ray negative. Trial of meloxicam.

## 2013-08-25 NOTE — Assessment & Plan Note (Signed)
Improving, continue lantus, novolog sliding scale. Advised pt to let us know if she has any further hypoglycemia.

## 2013-08-25 NOTE — Assessment & Plan Note (Addendum)
Trial of meloxicam, x ray to evaluate L spine- normal.  Consider MRI if symptoms worsen or do not improve.

## 2013-08-25 NOTE — Progress Notes (Signed)
Subjective:    Patient ID: Breanna Avery, female    DOB: 11-02-1970, 42 y.o.   MRN: [id number]  HPI  Breanna Avery is a 42 yr old female who presents today for follow up.  DM2- Since she has been on novolog- the highest she has seen is 160.  She had one reading of 50.  This was AC breakfast.  She continues lantus.  She is off metformin. She is using novolog on a sliding scale- generally using 4-5 units.   R leg pain- reports "shooting pains" down the right leg.  She continues neurontin.  Notes mild low back pain.  Has tingling which extends down to the toes.  Was intermittent but becoming more frequent.  States she has fallen 2 x last month and once this month. Leg just gave out on her. Happened at work.  She reports that she has also had right ankle pain which she attributes to injury from her fall.    She is a Scientist, clinical (histocompatibility and immunogenetics) at [employer].  Reports that due to falls and recent decreased stamina, she has been unable to work full time.  Has dropped to "PRN".  She is starting disability application. Initially denied in 2012 and attorney has sent [place] additional paperwork to complete.      Review of Systems    see HPI  Past Medical History  Diagnosis Date  . Diabetes mellitus without complication   . Hypertension   . History of herpes genitalis   . HTN (hypertension)     History   Social History  . Marital Status: Single    Spouse Name: N/A    Number of Children: 3  . Years of Education: N/A   Occupational History  .     Social History Main Topics  . Smoking status: Never Smoker   . Smokeless tobacco: Never Used  . Alcohol Use: Yes     Comment: social  . Drug Use: Not on file  . Sexual Activity: Not on file   Other Topics Concern  . 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 Partial hysterectomy      Family History  Problem Relation Age of Onset  . Hypertension Mother   . Diabetes Father   . Hypertension Sister   . Cancer Maternal Aunt     ovarian  . Cancer Maternal Uncle     lung  . Diabetes Maternal Grandmother   . Diabetes Maternal Grandfather   . Heart disease Neg Hx   . Diabetes Cousin     No Known Allergies  Current Outpatient Prescriptions on File Prior to Visit  Medication Sig Dispense Refill  . ACCU-CHEK FASTCLIX LANCETS MISC USE TO CHECK BLOOD SUGAR TWICE DAILY  102 each  1  . aspirin EC 81 MG tablet Take 81 mg by mouth daily.      . B-D ULTRAFINE III SHORT PEN 31G X 8 MM MISC       . ezetimibe (ZETIA) 10 MG tablet Take 1 tablet (10 mg total) by mouth daily.  30 tablet  3  . gabapentin (NEURONTIN) 300 MG capsule 300 mg. One caps by mouth every morning and  2 caps at bedtime.      . Glucose Blood (BLOOD GLUCOSE TEST STRIPS) STRP Use to check blood sugar twice a day. DX 250.00  100 each  2  . insulin aspart (NOVOLOG FLEXPEN) 100 UNIT/ML SOPN FlexPen sliding scale- check sugar and inject 3 times daily before meals as below:  <150-   Zero units 150-200 2 units 201-250 4 units 251-300 6 units 301-350 8 units 351-400 10 units >400             12 units and contact us.  3 pen  0  . Insulin Glargine (LANTUS SOLOSTAR) 100 UNIT/ML SOPN Inject 30 units SQ QHS  5 pen  5  . lisinopril (PRINIVIL,ZESTRIL) 10 MG tablet Take 10 mg by mouth daily.      Marland Kitchen omeprazole (PRILOSEC) 40 MG capsule Take 1 capsule (40 mg total) by mouth daily.  30 capsule  3  . rosuvastatin (CRESTOR) 20 MG tablet Take 1 tablet (20 mg total) by mouth daily.  30 tablet  3  . valACYclovir (VALTREX) 1000 MG tablet Take 1 tablet (1,000 mg total) by mouth daily.  30 tablet  5  . zolpidem (AMBIEN) 5 MG tablet Take 5 mg by mouth at bedtime as needed.       No current facility-administered medications on file prior to visit.     BP 124/80  Pulse 81  Temp(Src) 98.4 F (36.9 C) (Oral)  Resp 16  Ht 5' 2.5" (1.588 m)  Wt 164 lb (74.39 kg)  BMI 29.50 kg/m2  SpO2 99%    Objective:   Physical Exam  Constitutional: She is oriented to person, place, and time. She appears well-developed and well-nourished. No distress.  HENT:  Head: Normocephalic and atraumatic.  Cardiovascular: Normal rate and regular rhythm.   No murmur heard. Pulmonary/Chest: Effort normal and breath sounds normal. No respiratory distress. She has no wheezes. She has no rales. She exhibits no tenderness.  Musculoskeletal: She exhibits no edema.  Bilateral LE strength is 5/5.   Lymphadenopathy:    She has no cervical adenopathy.  Neurological: She is alert and oriented to person, place, and time.  Psychiatric: She has a normal mood and affect. Her behavior is normal. Judgment and thought content normal.          Assessment & Plan:

## 2013-09-11 ENCOUNTER — Telehealth: Payer: Self-pay | Admitting: *Deleted

## 2013-09-11 NOTE — Telephone Encounter (Signed)
Pt called to check on the status of her physical residual functional capacity questionnaire. Advised her Provider may have additional questions and may contact her this evening.  Pt states it is ok to leave detailed message on her voicemail re: questions.

## 2013-09-12 NOTE — Telephone Encounter (Signed)
Spoke to pt. She reports she is seeking disability for diabetes, back pain etc.  Advised pt that I would fill out her form but that I do not think that her diabetes diagnosis should preclude her from working. Also advised pt that spine imaging is normal.

## 2013-09-13 ENCOUNTER — Telehealth: Payer: Self-pay | Admitting: Family

## 2013-09-13 NOTE — Telephone Encounter (Signed)
Notified pt, she states she still has intermittent tingling in her right leg.  States she is unable to make any appts at this time as her insurance has run out and will have a new plan after the first of the year.

## 2013-09-13 NOTE — Telephone Encounter (Signed)
Form completed and left at front desk.

## 2013-09-13 NOTE — Telephone Encounter (Signed)
Pls call pt and let her know that I have completed her disability form.  If she is still having right leg tingling, I think she should be evaluated by neurology.

## 2013-10-16 ENCOUNTER — Ambulatory Visit: Payer: BC Managed Care – PPO | Admitting: Family

## 2013-10-23 ENCOUNTER — Ambulatory Visit (INDEPENDENT_AMBULATORY_CARE_PROVIDER_SITE_OTHER): Payer: 59 | Admitting: Family

## 2013-10-23 ENCOUNTER — Encounter: Payer: Self-pay | Admitting: Family

## 2013-10-23 VITALS — BP 116/82 | HR 91 | Temp 97.8°F | Resp 16 | Ht 62.5 in | Wt 164.1 lb

## 2013-10-23 DIAGNOSIS — M545 Low back pain, unspecified: Secondary | ICD-10-CM

## 2013-10-23 DIAGNOSIS — M79604 Pain in right leg: Secondary | ICD-10-CM

## 2013-10-23 DIAGNOSIS — G609 Hereditary and idiopathic neuropathy, unspecified: Secondary | ICD-10-CM

## 2013-10-23 DIAGNOSIS — E119 Type 2 diabetes mellitus without complications: Secondary | ICD-10-CM

## 2013-10-23 DIAGNOSIS — E785 Hyperlipidemia, unspecified: Secondary | ICD-10-CM

## 2013-10-23 DIAGNOSIS — G629 Polyneuropathy, unspecified: Secondary | ICD-10-CM

## 2013-10-23 DIAGNOSIS — R454 Irritability and anger: Secondary | ICD-10-CM

## 2013-10-23 LAB — BASIC METABOLIC PANEL
BUN: 12 mg/dL (ref 6–23)
CALCIUM: 10.3 mg/dL (ref 8.4–10.5)
CO2: 28 mEq/L (ref 19–32)
Chloride: 96 mEq/L (ref 96–112)
Creat: 0.74 mg/dL (ref 0.50–1.10)
Glucose, Bld: 153 mg/dL — ABNORMAL HIGH (ref 70–99)
POTASSIUM: 4.4 meq/L (ref 3.5–5.3)
SODIUM: 138 meq/L (ref 135–145)

## 2013-10-23 LAB — LIPID PANEL
CHOL/HDL RATIO: 8.3 ratio
Cholesterol: 292 mg/dL — ABNORMAL HIGH (ref 0–200)
HDL: 35 mg/dL — AB (ref >39)
LDL CALC: 203 mg/dL — AB (ref 0–99)
Triglycerides: 272 mg/dL — ABNORMAL HIGH (ref <150)
VLDL: 54 mg/dL — AB (ref 0–40)

## 2013-10-23 LAB — HEMOGLOBIN A1C
Hgb A1c MFr Bld: 10.2 % — ABNORMAL HIGH (ref <5.7)
MEAN PLASMA GLUCOSE: 246 mg/dL — AB (ref <117)

## 2013-10-23 NOTE — Assessment & Plan Note (Signed)
Clinically improved on gabapentin.

## 2013-10-23 NOTE — Progress Notes (Signed)
Pre visit review using our clinic review tool, if applicable. No additional management support is needed unless otherwise documented below in the visit note. 

## 2013-10-23 NOTE — Assessment & Plan Note (Addendum)
No improvement. Obtain A1C.  If above goal, plan to increase Lantus by 1 unit daily until fasting blood sugar reach 110, continue Novolog per sliding sca;e.   Patient to schedule eye exam with optometrist. Obtain BMET and HgA1C today.  Follow up in three months.

## 2013-10-23 NOTE — Patient Instructions (Signed)
Please schedule and eye exam at your earliest convenience. Complete your lab work prior to leaving. Follow up in 3 months.

## 2013-10-23 NOTE — Assessment & Plan Note (Signed)
Discussed adding exercise for stress relief.  Will monitor.

## 2013-10-23 NOTE — Progress Notes (Addendum)
   Subjective:    Patient ID: Breanna Avery, female    DOB: 07-26-1971, 43 y.o.   MRN: [id number]  HPI [name] is a 43 year old female who presents today for follow up.  Diabetes- HbA1c 8.6 last visit. Reports she's checking blood sugars three times daily before meals, reports her readings are "up and down" with the highest reading being 280.   Diet/Exercise- Patient reports she hasn't been eating healthy but plans on starting to eat healthy and exercise.  Leg cramps- Patient reports the cramping to her right leg has improved overall with the gabapentin.  Mood swings- Patient reports irritability over past month. Reports regular sleep but also reporting stress at home and work. Denies hot flashes, body aches.  Review of Systems  Constitutional: Negative for activity change.  HENT: Negative for rhinorrhea.   Respiratory: Negative for cough and shortness of breath.   Cardiovascular: Negative for chest pain.  Gastrointestinal: Negative for abdominal pain.  Endocrine: Positive for polydipsia and polyuria. Negative for polyphagia.  Genitourinary: Negative for hematuria and difficulty urinating.  Neurological: Negative for headaches.  Psychiatric/Behavioral: Positive for agitation. Negative for sleep disturbance.       Family reports increased aggitation over past month. Patient denies recent stress or disrupt in sleep.       Objective:   Physical Exam  Constitutional: She is oriented to person, place, and time. She appears well-nourished.  HENT:  Head: Atraumatic.  Cardiovascular: Normal rate and regular rhythm.   No murmur heard. Pulmonary/Chest: Breath sounds normal. No respiratory distress.  Musculoskeletal: Normal range of motion.  Neurological: She is alert and oriented to person, place, and time.  Skin: Skin is warm and dry.  Psychiatric: She has a normal mood and affect.          Assessment & Plan:  I have seen and examined this pt and agree with Ms's Breanna Avery's  exam, assessment and plan.

## 2013-10-23 NOTE — Assessment & Plan Note (Signed)
Improved. Continue Gabapentin.

## 2013-10-23 NOTE — Assessment & Plan Note (Signed)
Elevated triglycerides and LDL's with low HDL.  Obtain Lipid panel today, encouraged healthy diet and exercise. Continue Crestor.

## 2013-10-24 ENCOUNTER — Telehealth: Payer: Self-pay

## 2013-10-24 NOTE — Telephone Encounter (Signed)
Relevant patient education mailed to patient.  

## 2013-11-06 ENCOUNTER — Telehealth: Payer: Self-pay | Admitting: Family

## 2013-11-06 DIAGNOSIS — E119 Type 2 diabetes mellitus without complications: Secondary | ICD-10-CM

## 2013-11-06 NOTE — Telephone Encounter (Signed)
Patient is requesting last lab results 

## 2013-11-07 NOTE — Telephone Encounter (Signed)
Left message for pt to return my call and let us know if we can leave a detailed message on her voicemail.

## 2013-11-08 NOTE — Telephone Encounter (Signed)
Notified pt. Pt verified that she missed some dosage of medications. Reminded pt be compliant to her medication. Follow-up as scheduled in 01/23/14.  Notes Recorded by Sandford CrazeMelissa O'Sullivan, NP on 10/25/2013 at 9:29 AM Cholesterol and sugar both look much worse than it did a few months ago. Has she missed doses of crestor and insulin? If so, I recommend that she work on compliance. If she is taking as directed, then I will refer her to Endo for further assistance with med management.

## 2013-11-08 NOTE — Telephone Encounter (Signed)
Pt called back and states that she only missed a couple times on her insulin due to financial issues. Pt states that she would like to go ahead and be referred to endocrinology.  Please advise.

## 2013-11-09 NOTE — Addendum Note (Signed)
Addended by: Sandford Craze'SULLIVAN, Zabdi Mis on: 11/09/2013 08:57 AM   Modules accepted: Orders

## 2013-11-17 ENCOUNTER — Ambulatory Visit (INDEPENDENT_AMBULATORY_CARE_PROVIDER_SITE_OTHER): Payer: 59 | Admitting: Internal Medicine

## 2013-11-17 ENCOUNTER — Encounter: Payer: Self-pay | Admitting: Internal Medicine

## 2013-11-17 VITALS — BP 112/78 | HR 77 | Temp 98.1°F | Resp 10 | Ht 61.0 in | Wt 164.0 lb

## 2013-11-17 DIAGNOSIS — E119 Type 2 diabetes mellitus without complications: Secondary | ICD-10-CM

## 2013-11-17 MED ORDER — METFORMIN HCL ER 500 MG PO TB24: ORAL_TABLET | ORAL | Status: DC

## 2013-11-17 MED ORDER — INSULIN LISPRO 100 UNIT/ML (KWIKPEN): PEN_INJECTOR | SUBCUTANEOUS | Status: DC

## 2013-11-17 NOTE — Patient Instructions (Addendum)
Please continue Lantus 30 units at night. Please start Metformin XR 500 mg with dinner x 5 days. If you tolerate this well, add another Metformin tablet (500 mg) with breakfast x 5 days. If you tolerate this well, add another metformin tablet with dinner (total 1000 mg) x 5 days. If you tolerate this well, add another metformin tablets with breakfast (total 1000 mg). Continue with 1000 mg of metformin twice a day with breakfast and dinner.  Start mealtime Humalog insulin:  6 units with breakfast   6 units with lunch  8 units with dinner Check sugars at least 3x a day and write them down - bring log at each appointment with me. Please return in 1 month with your sugar log.   PATIENT INSTRUCTIONS FOR TYPE 2 DIABETES:  **Please join MyChart!** - see attached instructions about how to join   DIET AND EXERCISE Diet and exercise is an important part of diabetic treatment.  We recommended aerobic exercise in the form of brisk walking (working between 40-60% of maximal aerobic capacity, similar to brisk walking) for 150 minutes per week (such as 30 minutes five days per week) along with 3 times per week performing 'resistance' training (using various gauge rubber tubes with handles) 5-10 exercises involving the major muscle groups (upper body, lower body and core) performing 10-15 repetitions (or near fatigue) each exercise. Start at half the above goal but build slowly to reach the above goals. If limited by weight, joint pain, or disability, we recommend daily walking in a swimming pool with water up to waist to reduce pressure from joints while allow for adequate exercise.    BLOOD GLUCOSES Monitoring your blood glucoses is important for continued management of your diabetes. Please check your blood glucoses 2-4 times a day: fasting, before meals and at bedtime (you can rotate these measurements - e.g. one day check before the 3 meals, the next day check before 2 of the meals and before bedtime, etc.    HYPOGLYCEMIA (low blood sugar) Hypoglycemia is usually a reaction to not eating, exercising, or taking too much insulin/ other diabetes drugs.  Symptoms include tremors, sweating, hunger, confusion, headache, etc. Treat IMMEDIATELY with 15 grams of Carbs:   4 glucose tablets    cup regular juice/soda   2 tablespoons raisins   4 teaspoons sugar   1 tablespoon honey Recheck blood glucose in 15 mins and repeat above if still symptomatic/blood glucose <100. Please contact our office at [phone] if you have questions about how to next handle your insulin.  RECOMMENDATIONS TO REDUCE YOUR RISK OF DIABETIC COMPLICATIONS: * Take your prescribed MEDICATION(S). * Follow a DIABETIC diet: Complex carbs, fiber rich foods, heart healthy fish twice weekly, (monounsaturated and polyunsaturated) fats * AVOID saturated/trans fats, high fat foods, >2,300 mg salt per day. * EXERCISE at least 5 times a week for 30 minutes or preferably daily.  * DO NOT SMOKE OR DRINK more than 1 drink a day. * Check your FEET every day. Do not wear tightfitting shoes. Contact us if you develop an ulcer * See your EYE doctor once a year or more if needed * Get a FLU shot once a year * Get a PNEUMONIA vaccine once before and once after age 42 years  GOALS:  * Your Hemoglobin A1c of <7%  * fasting sugars need to be <130 * after meals sugars need to be <180 (2h after you start eating) * Your Systolic BP should be 140 or lower  * Your  Diastolic BP should be 80 or lower  * Your HDL (Good Cholesterol) should be 40 or higher  * Your LDL (Bad Cholesterol) should be 100 or lower  * Your Triglycerides should be 150 or lower  * Your Urine microalbumin (kidney function) should be <30 * Your Body Mass Index should be 25 or lower   We will be glad to help you achieve these goals. Our telephone number is: [phone].

## 2013-11-17 NOTE — Progress Notes (Signed)
Patient ID: Breanna Avery, female   DOB: September 11, 1971, 43 y.o.   MRN: [id number]  HPI: Breanna Avery is a 43 y.o.-year-old female, referred by her PCP, Sandford Craze, for management of DM2, insulin-dependent, uncontrolled, with complications (PN).  Patient has been diagnosed with diabetes in 12/2010; she started insulin at dx.  Last hemoglobin A1c was: Lab Results  Component Value Date   HGBA1C 10.2* 10/23/2013   HGBA1C 8.6* 07/17/2013   HGBA1C 8.1* 02/28/2013   Pt is on a regimen of: - Lantus 30 units qhs - Novolog SSI units tid ac - switched from Metformin on and off since dx >> 2014,  150-200: 2 units 201-250: 4 units 251-300: 6 units, etc.  She was on Metformin >> diarrhea.   Pt checks her sugars 3x a day and they are: - am: 150-200 - 2h after b'fast: n/c - before lunch: low 200s - 2h after lunch: n/c - before dinner: 280 - 2h after dinner: n/c - bedtime: upper 200s-300s - nighttime: n/c No lows. Lowest sugar was 150; she has hypoglycemia awareness at 70.  Highest sugar was 380.  Pt's meals are: - Breakfast: toast wheat, eggs - might skip 3x a week - Lunch: sandwich, salad, yoghurt - Dinner: baked chicken; mac and cheese, veggies - Snacks: one - chips Drinks some some diet sodas, not every day.  She walks for exercise - 30 min at work.  She works 3 pm-11 pm.  - no CKD, last BUN/creatinine:  Lab Results  Component Value Date   BUN 12 10/23/2013   CREATININE 0.74 10/23/2013  On lisinopril. Lab Results  Component Value Date   MICRALBCREAT 5.2 07/17/2013   - last set of lipids: Lab Results  Component Value Date   CHOL 292* 10/23/2013   HDL 35* 10/23/2013   LDLCALC 203* 10/23/2013   TRIG 272* 10/23/2013   CHOLHDL 8.3 10/23/2013  On Zetia, Crestor.  On ASA.  - last eye exam was in 2013. No DR.  - + numbness and tingling in her feet. She is on Neurontin 1 in am and 2 in hs.  Pt has FH of DM in father, MGM, uncles, and cousins.  ROS: Constitutional:+ weight  gain, no fatigue, no subjective hyperthermia/hypothermia Eyes: + blurry vision, no xerophthalmia ENT: no sore throat, no nodules palpated in throat, no dysphagia/odynophagia, no hoarseness Cardiovascular: + CP/+ SOB/no palpitations/leg swelling Respiratory: no cough/+ SOB Gastrointestinal: no N/V/D/C, + heartburn Musculoskeletal: no muscle/joint aches Skin: no rashes Neurological: no tremors/numbness/tingling/dizziness, + HA Psychiatric: no depression/anxiety  Past Medical History  Diagnosis Date  . Diabetes mellitus without complication   . Hypertension   . History of herpes genitalis   . HTN (hypertension)    Past Surgical History  Procedure Laterality Date  . Partial hysterectomy     History   Social History  . Marital Status: Single    Spouse Name: N/A    Number of Children: 3   Social History Main Topics  . Smoking status: Never Smoker   . Smokeless tobacco: Never Used  . Alcohol Use: Yes     Comment: social  . Drug Use: No   Social History Narrative   Single   3 children- ages 56 (son), 21 (daughter) and 15 (son)   Younger two children at home   She is a med Best boy at [employer]   Completed 1 year of college   Enjoys movies, relaxing, bowling   Current Outpatient Prescriptions on File Prior to Visit  Medication Sig Dispense  Refill  . ACCU-CHEK FASTCLIX LANCETS MISC USE TO CHECK BLOOD SUGAR TWICE DAILY  102 each  1  . aspirin EC 81 MG tablet Take 81 mg by mouth daily.      . B-D ULTRAFINE III SHORT PEN 31G X 8 MM MISC       . ezetimibe (ZETIA) 10 MG tablet Take 1 tablet (10 mg total) by mouth daily.  30 tablet  3  . gabapentin (NEURONTIN) 300 MG capsule 300 mg. One caps by mouth every morning and 2 caps at bedtime.      . Glucose Blood (BLOOD GLUCOSE TEST STRIPS) STRP Use to check blood sugar twice a day. DX 250.00  100 each  2  . Insulin Glargine (LANTUS SOLOSTAR) 100 UNIT/ML SOPN Inject 30 units SQ QHS  5 pen  5  . lisinopril (PRINIVIL,ZESTRIL) 10 MG  tablet Take 10 mg by mouth daily.      . meloxicam (MOBIC) 7.5 MG tablet Take 1 tablet (7.5 mg total) by mouth daily.  14 tablet  0  . omeprazole (PRILOSEC) 40 MG capsule Take 1 capsule (40 mg total) by mouth daily.  30 capsule  3  . rosuvastatin (CRESTOR) 20 MG tablet Take 1 tablet (20 mg total) by mouth daily.  30 tablet  3  . valACYclovir (VALTREX) 1000 MG tablet Take 1 tablet (1,000 mg total) by mouth daily.  30 tablet  5  . zolpidem (AMBIEN) 5 MG tablet Take 5 mg by mouth at bedtime as needed.      . [DISCONTINUED] insulin aspart (NOVOLOG FLEXPEN) 100 UNIT/ML SOPN FlexPen sliding scale- check sugar and inject 3 times daily before meals as below:  <150-   Zero units 150-200 2 units 201-250 4 units 251-300 6 units 301-350 8 units 351-400 10 units >400             12 units and contact us.  3 pen  0   No current facility-administered medications on file prior to visit.   No Known Allergies Family History  Problem Relation Age of Onset  . Hypertension Mother   . Diabetes Father   . Hypertension Sister   . Cancer Maternal Aunt     ovarian  . Cancer Maternal Uncle     lung  . Diabetes Maternal Grandmother   . Diabetes Maternal Grandfather   . Heart disease Neg Hx   . Diabetes Cousin    PE: BP 112/78  Pulse 77  Temp(Src) 98.1 F (36.7 C) (Oral)  Resp 10  Ht 5\' 1"  (1.549 m)  Wt 164 lb (74.39 kg)  BMI 31.00 kg/m2  SpO2 98% Wt Readings from Last 3 Encounters:  11/17/13 164 lb (74.39 kg)  10/23/13 164 lb 1.9 oz (74.444 kg)  08/25/13 164 lb (74.39 kg)   Constitutional: overweight, in NAD Eyes: PERRLA, EOMI, no exophthalmos ENT: moist mucous membranes, no thyromegaly, no cervical lymphadenopathy Cardiovascular: RRR, No MRG Respiratory: CTA B Gastrointestinal: abdomen soft, NT, ND, BS+ Musculoskeletal: no deformities, strength intact in all 4 Skin: moist, warm, no rashes, + acanthosis nigricans on neck Neurological: no tremor with outstretched hands, DTR normal in all  4  ASSESSMENT: 1. DM2, non-insulin-dependent, uncontrolled, without complications  PLAN:  1. Patient with long-standing, recently more uncontrolled diabetes - We discussed about options for treatment, and I suggested to:  Patient Instructions  Please continue Lantus 30 units at night. Please start Metformin XR 500 mg with dinner x 5 days. If you tolerate this well, add another Metformin  tablet (500 mg) with breakfast x 5 days. If you tolerate this well, add another metformin tablet with dinner (total 1000 mg) x 5 days. If you tolerate this well, add another metformin tablets with breakfast (total 1000 mg). Continue with 1000 mg of metformin twice a day with breakfast and dinner.  Start mealtime Humalog insulin:  6 units with breakfast   6 units with lunch  8 units with dinner Check sugars at least 3x a day and write them down - bring log at each appointment with me. Please return in 1 month with your sugar log.  - given samples of Lantus and NovoLog pen - Strongly advised her to start checking sugars at different times of the day - check 3-4 times a day, rotating checks - given sugar log and advised how to fill it and to bring it at next appt  - given foot care handout and explained the principles  - given instructions for hypoglycemia management "15-15 rule"  - advised for yearly eye exams - Return to clinic in 1 mo with sugar log

## 2013-11-27 ENCOUNTER — Telehealth: Payer: Self-pay | Admitting: Family

## 2013-11-27 ENCOUNTER — Ambulatory Visit: Payer: 59 | Admitting: Family

## 2013-11-27 DIAGNOSIS — E1165 Type 2 diabetes mellitus with hyperglycemia: Principal | ICD-10-CM

## 2013-11-27 DIAGNOSIS — IMO0001 Reserved for inherently not codable concepts without codable children: Secondary | ICD-10-CM

## 2013-11-27 NOTE — Telephone Encounter (Signed)
Patient states that we had referred her to [facility] awhile back but she could not go because of her insurance. [facility] and needs new referral.

## 2013-11-27 NOTE — Telephone Encounter (Signed)
Please advise 

## 2013-11-27 NOTE — Telephone Encounter (Signed)
I have never seen patient.  Referral needs to come from patient's PCP.

## 2013-12-04 ENCOUNTER — Encounter: Payer: Self-pay | Admitting: Family

## 2013-12-04 ENCOUNTER — Ambulatory Visit (INDEPENDENT_AMBULATORY_CARE_PROVIDER_SITE_OTHER): Payer: 59 | Admitting: Family

## 2013-12-04 VITALS — BP 126/84 | HR 75 | Temp 97.5°F | Resp 16 | Ht 62.5 in | Wt 165.1 lb

## 2013-12-04 DIAGNOSIS — R0789 Other chest pain: Secondary | ICD-10-CM

## 2013-12-04 DIAGNOSIS — R454 Irritability and anger: Secondary | ICD-10-CM

## 2013-12-04 DIAGNOSIS — E119 Type 2 diabetes mellitus without complications: Secondary | ICD-10-CM

## 2013-12-04 MED ORDER — INSULIN GLARGINE 100 UNIT/ML SOLOSTAR PEN: PEN_INJECTOR | SUBCUTANEOUS | Status: DC

## 2013-12-04 MED ORDER — ESCITALOPRAM OXALATE 10 MG PO TABS: ORAL_TABLET | ORAL | Status: DC

## 2013-12-04 MED ORDER — INSULIN LISPRO 100 UNIT/ML (KWIKPEN): PEN_INJECTOR | SUBCUTANEOUS | Status: DC

## 2013-12-04 NOTE — Progress Notes (Signed)
Pre visit review using our clinic review tool, if applicable. No additional management support is needed unless otherwise documented below in the visit note. 

## 2013-12-04 NOTE — Assessment & Plan Note (Signed)
Patient following with Dr. Lafe GarinGherge who is managing medications. Discussed diet and exercise. Will refer for eye exam.

## 2013-12-04 NOTE — Patient Instructions (Addendum)
Start Lexapro 10mg  tablets. Take one half tablet daily for seven days for depression, then take one full tablet on day eight. You will be contacted regarding your eye exam. Follow up in one month.

## 2013-12-04 NOTE — Assessment & Plan Note (Signed)
Start Lexapro 10mg  for irritability and depression. Advised patient to go to the emergency department if she felt suicidal, patient verbalized understanding. Follow up in one month.

## 2013-12-04 NOTE — Progress Notes (Signed)
Subjective:    Patient ID: Breanna Avery, female    DOB: 1971-05-18, 43 y.o.   MRN: [id number]  HPI [name] is a 43 year old female who presents today for follow up from a recent hospitalization.  Patient was hospitalized for chest pain with radiation to left arm and shortness of breath at [facility] in mid February.  Patient reports they did a stress test which patient reports was negative and does not remember if she was to follow with cardiology. Patient was admitted for 23 hour observation. Patient denies chest pain and shortness of breath since admission..  Diabetes) Patient is managed by Dr. Elvera LennoxGherghe for diabetes type two and was seen in early February 2015. Patient is in process of scheduling an eye appointment and needs a referral. Patient reports blood sugars are running between 135 to 240 since alteration of Novolog per Dr. Lafe GarinGherge. Patient has appointment scheduled for an appointment in early March. Patient reports she started to exercise three times weekly with her daughter and is working towards a healthy diet.  Depression and Anxiety) Patient reports frequent tearfulness and not wanting to get out of bed for about 2 months. Patient states she has to force herself to go to work and participate in daily activities. Patient also reporting irritability and anxiety. Denies SI/HI.    Review of Systems  Respiratory: Negative for shortness of breath.   Cardiovascular: Negative for chest pain and leg swelling.  Neurological: Negative for dizziness and headaches.  Psychiatric/Behavioral: Positive for agitation. Negative for self-injury. The patient is nervous/anxious.        Patient reports anxiety and depression.   Past Medical History  Diagnosis Date  . Diabetes mellitus without complication   . Hypertension   . History of herpes genitalis   . HTN (hypertension)     History   Social History  . Marital Status: Single    Spouse Name: N/A    Number of Children: 3    . Years of Education: N/A   Occupational History  .     Social History Main Topics  . Smoking status: Never Smoker   . Smokeless tobacco: Never Used  . Alcohol Use: Yes     Comment: social  . Drug Use: Not on file  . Sexual Activity: Not on file   Other Topics Concern  . 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 Partial hysterectomy      Family History  Problem Relation Age of Onset  . Hypertension Mother   . Diabetes Father   . Hypertension Sister   . Cancer Maternal Aunt     ovarian  . Cancer Maternal Uncle     lung  . Diabetes Maternal Grandmother   . Diabetes Maternal Grandfather   . Heart disease Neg Hx   . Diabetes Cousin     No Known Allergies  Current Outpatient Prescriptions on File Prior to Visit  Medication Sig Dispense Refill  . ACCU-CHEK FASTCLIX LANCETS MISC USE TO CHECK BLOOD SUGAR TWICE DAILY  102 each  1  . aspirin EC 81 MG tablet Take 81 mg by mouth daily.      .Marland Kitchen  B-D ULTRAFINE III SHORT PEN 31G X 8 MM MISC       . CRESTOR 20 MG tablet Take 20 mg by mouth daily.       Marland Kitchen ezetimibe (ZETIA) 10 MG tablet Take 1 tablet (10 mg total) by mouth daily.  30 tablet  3  . gabapentin (NEURONTIN) 300 MG capsule 300 mg. One caps by mouth every morning and 2 caps at bedtime.      . Glucose Blood (BLOOD GLUCOSE TEST STRIPS) STRP Use to check blood sugar twice a day. DX 250.00  100 each  2  . lisinopril (PRINIVIL,ZESTRIL) 10 MG tablet Take 10 mg by mouth daily.      . meloxicam (MOBIC) 7.5 MG tablet Take 1 tablet (7.5 mg total) by mouth daily.  14 tablet  0  . metFORMIN (GLUCOPHAGE-XR) 500 MG 24 hr tablet Take 2 tablets with breakfast and 2 with dinner.  120 tablet  3  . omeprazole (PRILOSEC) 40 MG capsule  Take 1 capsule (40 mg total) by mouth daily.  30 capsule  3  . rosuvastatin (CRESTOR) 20 MG tablet Take 1 tablet (20 mg total) by mouth daily.  30 tablet  3  . valACYclovir (VALTREX) 1000 MG tablet Take 1 tablet (1,000 mg total) by mouth daily.  30 tablet  5  . zolpidem (AMBIEN) 5 MG tablet Take 5 mg by mouth at bedtime as needed.       No current facility-administered medications on file prior to visit.    BP 126/84  Pulse 75  Temp(Src) 97.5 F (36.4 C) (Oral)  Resp 16  Ht 5' 2.5" (1.588 m)  Wt 165 lb 1.3 oz (74.88 kg)  BMI 29.69 kg/m2  SpO2 99%       Objective:   Physical Exam  Constitutional: She is oriented to person, place, and time. She appears well-nourished.  HENT:  Head: Normocephalic.  Cardiovascular: Normal rate, regular rhythm, S1 normal, S2 normal and normal heart sounds.   Pulses:      Radial pulses are 2+ on the right side, and 2+ on the left side.  Pulmonary/Chest: Effort normal and breath sounds normal. No respiratory distress. She has no wheezes.  Musculoskeletal: She exhibits no edema.  Neurological: She is alert and oriented to person, place, and time.  Skin: Skin is warm and dry.  Psychiatric: She has a normal mood and affect.          Assessment & Plan:  I have personally seen and examined patient and agree with Vernona Rieger NP-student's assessment and plan.

## 2013-12-05 DIAGNOSIS — R0789 Other chest pain: Secondary | ICD-10-CM | POA: Insufficient documentation

## 2013-12-15 ENCOUNTER — Telehealth: Payer: Self-pay | Admitting: *Deleted

## 2013-12-15 LAB — HM DIABETES EYE EXAM

## 2013-12-15 MED ORDER — LISINOPRIL 10 MG PO TABS: 10.0000 mg | ORAL_TABLET | Freq: Every day | ORAL | Status: DC

## 2013-12-15 NOTE — Telephone Encounter (Signed)
Pt left message requesting refill of lisinopril. Refill sent. Left message on voicemail that request was completed and to call if any questions.

## 2013-12-19 ENCOUNTER — Ambulatory Visit: Payer: 59 | Admitting: Internal Medicine

## 2013-12-29 ENCOUNTER — Ambulatory Visit (INDEPENDENT_AMBULATORY_CARE_PROVIDER_SITE_OTHER): Payer: 59 | Admitting: Internal Medicine

## 2013-12-29 ENCOUNTER — Encounter: Payer: Self-pay | Admitting: Internal Medicine

## 2013-12-29 VITALS — BP 126/76 | HR 95 | Temp 98.0°F | Resp 12 | Wt 164.0 lb

## 2013-12-29 DIAGNOSIS — E119 Type 2 diabetes mellitus without complications: Secondary | ICD-10-CM

## 2013-12-29 MED ORDER — INSULIN PEN NEEDLE 31G X 5 MM MISC: Status: DC

## 2013-12-29 NOTE — Patient Instructions (Addendum)
Please change the insulin regimen as follows: - continue Lantus 30 units at bedtime - NovoLog insulin with meals:  6 units with breakfast   6 units with lunch  10 units with dinner If you see that your sugars drop after exercise in am >> decrease Novolog with b'fast that day by 2 units. - add the following sliding scale: - 150- 165: + 1 unit  - 166- 180: + 2 units  - 181- 195: + 3 units  - 196- 210: + 4 units  - >200: + 5 units  Continue Metfomin XR, but increase the dose to 2000 mg daily.  Please return in 6 weeks with your sugar log.

## 2013-12-29 NOTE — Progress Notes (Signed)
Patient ID: Breanna Avery, female   DOB: [birth year], 43 y.o.   MRN: [id number]  HPI: Breanna Avery is a 43 y.o.-year-old female, returning for f/u for DM2, dx 12/2010, insulin-dependent since dx, uncontrolled, with complications (PN).  Last hemoglobin A1c was: Lab Results  Component Value Date   HGBA1C 10.2* 10/23/2013   HGBA1C 8.6* 07/17/2013   HGBA1C 8.1* 02/28/2013   Pt is on a regimen of: - Lantus 30 units qhs - NovoLog insulin - added 11/2013:  6 units with breakfast   6 units with lunch  8 units with dinner - Novolog SSI units tid ac  150-200: 2 units 201-250: 4 units 251-300: 6 units, etc.  She was on Metformin >> diarrhea >> started Metformin XR in 11/2013.  Pt checks her sugars 3x a day and they are improved (per log): - am: 150-200 >> 130-180 - 2h after b'fast: n/c  - before lunch: low 200s >> 90-196 - 2h after lunch: n/c - before dinner: 280 >> 121-200 - 2h after dinner: n/c - bedtime: upper 200s-300s >> 209-309 - nighttime: n/c  No lows. Lowest sugar was 90; she has hypoglycemia awareness at 70.  Highest sugar was 380.  Pt's meals are: - Breakfast: toast wheat, eggs - does not skip it anymore - Lunch: sandwich, salad, yoghurt - Dinner: baked chicken; mac and cheese, veggies - Snacks: one - chips Drinks some some diet sodas, not every day.  She walks for exercise - 30 min at work. She started to go to the gym 3x a week.  She works 3 pm-11 pm.  - no CKD, last BUN/creatinine:  Lab Results  Component Value Date   BUN 12 10/23/2013   CREATININE 0.74 10/23/2013  On lisinopril. Lab Results  Component Value Date   MICRALBCREAT 5.2 07/17/2013   - last set of lipids: Lab Results  Component Value Date   CHOL 292* 10/23/2013   HDL 35* 10/23/2013   LDLCALC 203* 10/23/2013   TRIG 272* 10/23/2013   CHOLHDL 8.3 10/23/2013  On Zetia, Crestor.  On ASA.  - last eye exam was in 12/2013. No DR.  - + numbness and tingling in her feet. She is on Neurontin 1 in am and 2  in hs, but she only takes it in hs as she gets sleepy in am if she takes.   I reviewed pt's medications, allergies, PMH, social hx, family hx and no changes required, except as mentioned above.  ROS: Constitutional: no weight gain/loss, no fatigue, + hot flushes Eyes: no blurry vision, no xerophthalmia ENT: no sore throat, no nodules palpated in throat, no dysphagia/odynophagia, no hoarseness Cardiovascular: no CP/SOB/palpitations/leg swelling Respiratory: no cough/SOB Gastrointestinal: no N/V/D/C Musculoskeletal: no muscle/joint aches Skin: no rashes Neurological: no tremors/numbness/tingling/dizziness; + increased feet burning at night  PE: BP 126/76  Pulse 95  Temp(Src) 98 F (36.7 C) (Oral)  Resp 12  Wt 164 lb (74.39 kg)  SpO2 97% Wt Readings from Last 3 Encounters:  12/29/13 164 lb (74.39 kg)  12/04/13 165 lb 1.3 oz (74.88 kg)  11/17/13 164 lb (74.39 kg)   Constitutional: overweight, in NAD Eyes: PERRLA, EOMI, no exophthalmos ENT: moist mucous membranes, no thyromegaly, no cervical lymphadenopathy Cardiovascular: RRR, No MRG Respiratory: CTA B Gastrointestinal: abdomen soft, NT, ND, BS+ Musculoskeletal: no deformities, strength intact in all 4 Skin: moist, warm, no rashes, + acanthosis nigricans on neck Neurological: no tremor with outstretched hands, DTR normal in all 4  ASSESSMENT: 1. DM2, nsulin-dependent, uncontrolled, with complications - PN  PLAN:  1. Patient with long-standing, recently more uncontrolled diabetes - but with improved ctrl after adding Metformin XT and mealtime insulin - We discussed about options for treatment, and I suggested to:  Patient Instructions  Please change the insulin regimen as follows: - continue Lantus 30 units at bedtime - NovoLog insulin with meals:  6 units with breakfast   6 units with lunch  10 units with dinner If you see that your sugars drop after exercise in am >> decrease Novolog with b'fast that day by 2  units. - add the following sliding scale: - 150- 165: + 1 unit  - 166- 180: + 2 units  - 181- 195: + 3 units  - 196- 210: + 4 units  - >200: + 5 units  Continue Metfomin XR, but increase the dose to 2000 mg daily.  Please return in 6 weeks with your sugar log.  - advised to increase Neurontin to 3 tabs at night - refilled pen needles - continue checking sugars at different times of the day - check 3-4 times a day, rotating checks - given new sugar log sheets - advised for yearly eye exams - she is up to date - Return to clinic in 6 weeks with sugar log

## 2014-01-23 ENCOUNTER — Ambulatory Visit (INDEPENDENT_AMBULATORY_CARE_PROVIDER_SITE_OTHER): Payer: 59 | Admitting: Family

## 2014-01-23 ENCOUNTER — Encounter: Payer: Self-pay | Admitting: Family

## 2014-01-23 VITALS — BP 124/82 | HR 66 | Temp 98.0°F | Resp 16 | Ht 62.5 in | Wt 166.0 lb

## 2014-01-23 DIAGNOSIS — I10 Essential (primary) hypertension: Secondary | ICD-10-CM

## 2014-01-23 DIAGNOSIS — E119 Type 2 diabetes mellitus without complications: Secondary | ICD-10-CM

## 2014-01-23 DIAGNOSIS — E785 Hyperlipidemia, unspecified: Secondary | ICD-10-CM

## 2014-01-23 DIAGNOSIS — R454 Irritability and anger: Secondary | ICD-10-CM

## 2014-01-23 DIAGNOSIS — B353 Tinea pedis: Secondary | ICD-10-CM

## 2014-01-23 LAB — BASIC METABOLIC PANEL
BUN: 11 mg/dL (ref 6–23)
CO2: 30 mEq/L (ref 19–32)
Calcium: 10.3 mg/dL (ref 8.4–10.5)
Chloride: 102 mEq/L (ref 96–112)
Creat: 0.72 mg/dL (ref 0.50–1.10)
Glucose, Bld: 161 mg/dL — ABNORMAL HIGH (ref 70–99)
POTASSIUM: 4.6 meq/L (ref 3.5–5.3)
SODIUM: 139 meq/L (ref 135–145)

## 2014-01-23 LAB — HEPATIC FUNCTION PANEL
ALBUMIN: 4.4 g/dL (ref 3.5–5.2)
ALT: 38 U/L — AB (ref 0–35)
AST: 25 U/L (ref 0–37)
Alkaline Phosphatase: 96 U/L (ref 39–117)
Bilirubin, Direct: <0.1 mg/dL (ref 0.0–0.3)
TOTAL PROTEIN: 7.8 g/dL (ref 6.0–8.3)
Total Bilirubin: 0.3 mg/dL (ref 0.2–1.2)

## 2014-01-23 LAB — LIPID PANEL
CHOL/HDL RATIO: 8.5 ratio
Cholesterol: 256 mg/dL — ABNORMAL HIGH (ref 0–200)
HDL: 30 mg/dL — ABNORMAL LOW (ref >39)
LDL Cholesterol: 183 mg/dL — ABNORMAL HIGH (ref 0–99)
TRIGLYCERIDES: 214 mg/dL — AB (ref <150)
VLDL: 43 mg/dL — AB (ref 0–40)

## 2014-01-23 NOTE — Progress Notes (Signed)
Pre visit review using our clinic review tool, if applicable. No additional management support is needed unless otherwise documented below in the visit note. 

## 2014-01-23 NOTE — Progress Notes (Signed)
Subjective:    Patient ID: Breanna Avery, female    DOB: 09-08-1971, 43 y.o.   MRN: [id number]  HPI  [name] is a 43 yr old female who presents today for follow up.  1) DM2-  Maintained on lantus 30units + novolog per sliding scale.  She follows with endocrinology- Dr. Elvera LennoxGherghe.  Reports recent sugars have been in the 100's. Lab Results  Component Value Date   HGBA1C 10.2* 10/23/2013   2) HTN-  Currently on lisinopril. Denies cough on ACE. BP Readings from Last 3 Encounters:  01/23/14 124/82  12/29/13 126/76  12/04/13 126/84   3) Hyperlipidemia-  Back on zetia and crestor. Denies myalgia. Lab Results  Component Value Date   CHOL 292* 10/23/2013   HDL 35* 10/23/2013   LDLCALC 203* 10/23/2013   TRIG 272* 10/23/2013   CHOLHDL 8.3 10/23/2013   4) Irritability-  Maintained on lexaprol 10mg .     Review of Systems See HPI  Past Medical History  Diagnosis Date  . Diabetes mellitus without complication   . Hypertension   . History of herpes genitalis   . HTN (hypertension)     History   Social History  . Marital Status: Single    Spouse Name: N/A    Number of Children: 3  . Years of Education: N/A   Occupational History  .     Social History Main Topics  . Smoking status: Never Smoker   . Smokeless tobacco: Never Used  . Alcohol Use: Yes     Comment: social  . Drug Use: Not on file  . Sexual Activity: Not on file   Other Topics Concern  . 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 Partial hysterectomy      Family History  Problem Relation Age of Onset  . Hypertension Mother   . Diabetes Father   . Hypertension Sister   . Cancer Maternal Aunt     ovarian  . Cancer Maternal Uncle     lung  .  Diabetes Maternal Grandmother   . Diabetes Maternal Grandfather   . Heart disease Neg Hx   . Diabetes Cousin     No Known Allergies  Current Outpatient Prescriptions on File Prior to Visit  Medication Sig Dispense Refill  . ACCU-CHEK FASTCLIX LANCETS MISC USE TO CHECK BLOOD SUGAR TWICE DAILY  102 each  1  . aspirin EC 81 MG tablet Take 81 mg by mouth daily.      Marland Kitchen. escitalopram (LEXAPRO) 10 MG tablet 1/2 tab by mouth daily for 1 week, then increase to a full tab daily on week 2  30 tablet  0  . ezetimibe (ZETIA) 10 MG tablet Take 1 tablet (10 mg total) by mouth daily.  30 tablet  3  . gabapentin (NEURONTIN) 300 MG capsule 300 mg. One caps by mouth every morning and 2 caps at bedtime.      . Glucose Blood (BLOOD GLUCOSE TEST STRIPS) STRP Use to check blood sugar twice a day. DX 250.00  100 each  2  . insulin aspart (NOVOLOG FLEXPEN) 100 UNIT/ML FlexPen Inject 6 units at breakfast, 6 units at lunch and  10 units at supper and per sliding scale      . Insulin Glargine (LANTUS SOLOSTAR) 100 UNIT/ML Solostar Pen Inject 30 units SQ QHS  2 pen  0  . Insulin Pen Needle 31G X 5 MM MISC Use 4x a day  300 each  prn  . lisinopril (PRINIVIL,ZESTRIL) 10 MG tablet Take 1 tablet (10 mg total) by mouth daily.  30 tablet  3  . meloxicam (MOBIC) 7.5 MG tablet Take 1 tablet (7.5 mg total) by mouth daily.  14 tablet  0  . metFORMIN (GLUCOPHAGE-XR) 500 MG 24 hr tablet Take 2 tablets with breakfast and 2 with dinner.  120 tablet  3  . omeprazole (PRILOSEC) 40 MG capsule Take 1 capsule (40 mg total) by mouth daily.  30 capsule  3  . rosuvastatin (CRESTOR) 20 MG tablet Take 1 tablet (20 mg total) by mouth daily.  30 tablet  3  . valACYclovir (VALTREX) 1000 MG tablet Take 1 tablet (1,000 mg total) by mouth daily.  30 tablet  5  . zolpidem (AMBIEN) 5 MG tablet Take 5 mg by mouth at bedtime as needed.       No current facility-administered medications on file prior to visit.    BP 124/82  Pulse 66  Temp(Src) 98 F  (36.7 C) (Oral)  Resp 16  Ht 5' 2.5" (1.588 m)  Wt 166 lb 0.6 oz (75.315 kg)  BMI 29.87 kg/m2  SpO2 99%       Objective:   Physical Exam  Constitutional: She is oriented to person, place, and time. She appears well-developed and well-nourished. No distress.  HENT:  Head: Normocephalic and atraumatic.  Cardiovascular: Normal rate and regular rhythm.   No murmur heard. Pulmonary/Chest: Effort normal and breath sounds normal. No respiratory distress. She has no wheezes. She has no rales. She exhibits no tenderness.  Neurological: She is alert and oriented to person, place, and time.  Skin: Skin is warm and dry.  Psychiatric: She has a normal mood and affect. Her behavior is normal. Judgment and thought content normal.          Assessment & Plan:

## 2014-01-23 NOTE — Assessment & Plan Note (Signed)
Recommended topical lotrimin.

## 2014-01-23 NOTE — Assessment & Plan Note (Signed)
Reports improved mood on lexapro, continue same.

## 2014-01-23 NOTE — Patient Instructions (Signed)
Apply lotrimin twice daily to feet. Complete lab work prior to leaving. Folllow up in 3 months.

## 2014-01-23 NOTE — Assessment & Plan Note (Signed)
Obtain follow up Lipid panel and hepatic panel.

## 2014-01-23 NOTE — Assessment & Plan Note (Addendum)
Improving, management per endo. Obtain bmet.

## 2014-01-23 NOTE — Assessment & Plan Note (Signed)
BP stable.  Continue lisinopril 

## 2014-01-24 ENCOUNTER — Telehealth: Payer: Self-pay | Admitting: Family

## 2014-01-24 DIAGNOSIS — R7989 Other specified abnormal findings of blood chemistry: Secondary | ICD-10-CM

## 2014-01-24 DIAGNOSIS — E785 Hyperlipidemia, unspecified: Secondary | ICD-10-CM

## 2014-01-24 DIAGNOSIS — R945 Abnormal results of liver function studies: Secondary | ICD-10-CM

## 2014-01-24 MED ORDER — ROSUVASTATIN CALCIUM 40 MG PO TABS: 40.0000 mg | ORAL_TABLET | Freq: Every day | ORAL | Status: DC

## 2014-01-24 NOTE — Telephone Encounter (Signed)
One of her liver tests is slightly elevated.  Most likely due to fatty liver.  I would like her to complete an abdominal US to further evaluate. Cholesterol remains very high.  Increase crestor from 20mg  to 40 mg, work hard on low fat/low cholesterol diet, exercise, weight loss. Continue zetia.  Repeat flp/lft in 6 weeks, dx is hyperlipidemia.

## 2014-01-24 NOTE — Telephone Encounter (Signed)
Notified pt and she voices understanding. Pt is agreeable to proceed with u/s. Lab order entered.

## 2014-01-25 ENCOUNTER — Telehealth: Payer: Self-pay | Admitting: Family

## 2014-01-25 ENCOUNTER — Encounter: Payer: Self-pay | Admitting: Family

## 2014-01-25 ENCOUNTER — Ambulatory Visit (HOSPITAL_BASED_OUTPATIENT_CLINIC_OR_DEPARTMENT_OTHER)
Admission: RE | Admit: 2014-01-25 | Discharge: 2014-01-25 | Disposition: A | Discharge status: Home / Self Care | Payer: 59 | Source: Ambulatory Visit | Attending: Family | Admitting: Family

## 2014-01-25 DIAGNOSIS — K8020 Calculus of gallbladder without cholecystitis without obstruction: Secondary | ICD-10-CM | POA: Insufficient documentation

## 2014-01-25 DIAGNOSIS — R945 Abnormal results of liver function studies: Secondary | ICD-10-CM | POA: Insufficient documentation

## 2014-01-25 DIAGNOSIS — K760 Fatty (change of) liver, not elsewhere classified: Secondary | ICD-10-CM

## 2014-01-25 DIAGNOSIS — K7689 Other specified diseases of liver: Secondary | ICD-10-CM | POA: Insufficient documentation

## 2014-01-25 HISTORY — DX: Calculus of gallbladder without cholecystitis without obstruction: K80.20

## 2014-01-25 HISTORY — DX: Fatty (change of) liver, not elsewhere classified: K76.0

## 2014-01-25 NOTE — Telephone Encounter (Signed)
US confirms fatty liver.  Please work on low fat/low cholesterol diet, exercise and weight loss to help this.  Note is also made of gallstones- This may never cause an issue for her but she should let us know if she develops pain in the right upper abdomen or persistent nausea in the future.

## 2014-01-26 NOTE — Telephone Encounter (Signed)
Left detailed message on cell and to call if any questions. 

## 2014-02-09 ENCOUNTER — Ambulatory Visit: Payer: 59 | Admitting: Internal Medicine

## 2014-02-09 DIAGNOSIS — Z0289 Encounter for other administrative examinations: Secondary | ICD-10-CM

## 2014-03-16 ENCOUNTER — Other Ambulatory Visit: Payer: Self-pay | Admitting: Family

## 2014-04-30 ENCOUNTER — Ambulatory Visit: Payer: 59 | Admitting: Family

## 2014-06-27 ENCOUNTER — Telehealth: Payer: Self-pay | Admitting: Internal Medicine

## 2014-06-27 ENCOUNTER — Ambulatory Visit: Payer: Self-pay | Admitting: Internal Medicine

## 2014-06-27 DIAGNOSIS — Z0289 Encounter for other administrative examinations: Secondary | ICD-10-CM

## 2014-06-27 NOTE — Telephone Encounter (Signed)
Patient no showed today's appt. Please advise on how to follow up. °A. No follow up necessary. °B. Follow up urgent. Contact patient immediately. °C. Follow up necessary. Contact patient and schedule visit in ___ days. °D. Follow up advised. Contact patient and schedule visit in ____weeks. ° °

## 2014-06-27 NOTE — Telephone Encounter (Signed)
Please read note below and advise.  

## 2014-06-28 NOTE — Telephone Encounter (Signed)
Within 1 month

## 2014-06-28 NOTE — Telephone Encounter (Signed)
Please read note below

## 2014-06-28 NOTE — Telephone Encounter (Signed)
LM for call back to resched appt

## 2014-08-09 ENCOUNTER — Ambulatory Visit: Payer: Self-pay | Admitting: Family

## 2014-08-09 ENCOUNTER — Encounter: Payer: Self-pay | Admitting: Family

## 2014-08-09 ENCOUNTER — Telehealth: Payer: Self-pay | Admitting: *Deleted

## 2014-08-09 DIAGNOSIS — Z0289 Encounter for other administrative examinations: Secondary | ICD-10-CM

## 2014-08-09 NOTE — Telephone Encounter (Signed)
Letter sent.  bw 

## 2014-08-09 NOTE — Telephone Encounter (Signed)
Pt did not show for appointment 08/09/2014 at 3:30pm for 2 month coughing / diabetic check

## 2014-08-09 NOTE — Telephone Encounter (Signed)
Please send no show

## 2014-09-03 ENCOUNTER — Ambulatory Visit: Payer: Self-pay | Admitting: Internal Medicine

## 2014-09-04 ENCOUNTER — Encounter (HOSPITAL_BASED_OUTPATIENT_CLINIC_OR_DEPARTMENT_OTHER): Payer: Self-pay | Admitting: *Deleted

## 2014-09-04 ENCOUNTER — Emergency Department (HOSPITAL_BASED_OUTPATIENT_CLINIC_OR_DEPARTMENT_OTHER)
Admission: EM | Admit: 2014-09-04 | Discharge: 2014-09-05 | Disposition: A | Discharge status: Home / Self Care | Payer: Self-pay | Attending: Emergency Medicine | Admitting: Emergency Medicine

## 2014-09-04 DIAGNOSIS — Z794 Long term (current) use of insulin: Secondary | ICD-10-CM | POA: Insufficient documentation

## 2014-09-04 DIAGNOSIS — Z8719 Personal history of other diseases of the digestive system: Secondary | ICD-10-CM | POA: Insufficient documentation

## 2014-09-04 DIAGNOSIS — E119 Type 2 diabetes mellitus without complications: Secondary | ICD-10-CM | POA: Insufficient documentation

## 2014-09-04 DIAGNOSIS — Z79899 Other long term (current) drug therapy: Secondary | ICD-10-CM | POA: Insufficient documentation

## 2014-09-04 DIAGNOSIS — Z7982 Long term (current) use of aspirin: Secondary | ICD-10-CM | POA: Insufficient documentation

## 2014-09-04 DIAGNOSIS — I10 Essential (primary) hypertension: Secondary | ICD-10-CM | POA: Insufficient documentation

## 2014-09-04 DIAGNOSIS — Z791 Long term (current) use of non-steroidal anti-inflammatories (NSAID): Secondary | ICD-10-CM | POA: Insufficient documentation

## 2014-09-04 DIAGNOSIS — Z8619 Personal history of other infectious and parasitic diseases: Secondary | ICD-10-CM | POA: Insufficient documentation

## 2014-09-04 DIAGNOSIS — J209 Acute bronchitis, unspecified: Secondary | ICD-10-CM | POA: Insufficient documentation

## 2014-09-04 DIAGNOSIS — Z792 Long term (current) use of antibiotics: Secondary | ICD-10-CM | POA: Insufficient documentation

## 2014-09-04 NOTE — ED Notes (Signed)
Cough worse at night. hx of sinus infection in September.

## 2014-09-04 NOTE — ED Notes (Signed)
cbg [id number] 

## 2014-09-05 MED ORDER — AZITHROMYCIN 250 MG PO TABS
ORAL_TABLET | ORAL | Status: DC
Start: 2014-09-05 — End: 2014-09-10

## 2014-09-05 MED ORDER — HYDROCOD POLST-CHLORPHEN POLST 10-8 MG/5ML PO LQCR: 5.0000 mL | Freq: Two times a day (BID) | ORAL | Status: DC | PRN

## 2014-09-05 NOTE — Discharge Instructions (Signed)
Zithromax as prescribed.  Tussionex as prescribed as needed for cough.  Follow-up with your primary Dr. as scheduled in the next week, and return to the ER if your symptoms significantly worsen or change.   Acute Bronchitis Bronchitis is inflammation of the airways that extend from the windpipe into the lungs (bronchi). The inflammation often causes mucus to develop. This leads to a cough, which is the most common symptom of bronchitis.  In acute bronchitis, the condition usually develops suddenly and goes away over time, usually in a couple weeks. Smoking, allergies, and asthma can make bronchitis worse. Repeated episodes of bronchitis may cause further lung problems.  CAUSES Acute bronchitis is most often caused by the same virus that causes a cold. The virus can spread from person to person (contagious) through coughing, sneezing, and touching contaminated objects. SIGNS AND SYMPTOMS   Cough.   Fever.   Coughing up mucus.   Body aches.   Chest congestion.   Chills.   Shortness of breath.   Sore throat.  DIAGNOSIS  Acute bronchitis is usually diagnosed through a physical exam. Your health care provider will also ask you questions about your medical history. Tests, such as chest X-rays, are sometimes done to rule out other conditions.  TREATMENT  Acute bronchitis usually goes away in a couple weeks. Oftentimes, no medical treatment is necessary. Medicines are sometimes given for relief of fever or cough. Antibiotic medicines are usually not needed but may be prescribed in certain situations. In some cases, an inhaler may be recommended to help reduce shortness of breath and control the cough. A cool mist vaporizer may also be used to help thin bronchial secretions and make it easier to clear the chest.  HOME CARE INSTRUCTIONS  Get plenty of rest.   Drink enough fluids to keep your urine clear or pale yellow (unless you have a medical condition that requires fluid  restriction). Increasing fluids may help thin your respiratory secretions (sputum) and reduce chest congestion, and it will prevent dehydration.   Take medicines only as directed by your health care provider.  If you were prescribed an antibiotic medicine, finish it all even if you start to feel better.  Avoid smoking and secondhand smoke. Exposure to cigarette smoke or irritating chemicals will make bronchitis worse. If you are a smoker, consider using nicotine gum or skin patches to help control withdrawal symptoms. Quitting smoking will help your lungs heal faster.   Reduce the chances of another bout of acute bronchitis by washing your hands frequently, avoiding people with cold symptoms, and trying not to touch your hands to your mouth, nose, or eyes.   Keep all follow-up visits as directed by your health care provider.  SEEK MEDICAL CARE IF: Your symptoms do not improve after 1 week of treatment.  SEEK IMMEDIATE MEDICAL CARE IF:  You develop an increased fever or chills.   You have chest pain.   You have severe shortness of breath.  You have bloody sputum.   You develop dehydration.  You faint or repeatedly feel like you are going to pass out.  You develop repeated vomiting.  You develop a severe headache. MAKE SURE YOU:   Understand these instructions.  Will watch your condition.  Will get help right away if you are not doing well or get worse. Document Released: 11/05/2004 Document Revised: 02/12/2014 Document Reviewed: 03/21/2013 [facility] Patient Information 2015 [place], [place]. This information is not intended to replace advice given to you by your health care provider.  Make sure you discuss any questions you have with your health care provider. ° °

## 2014-09-05 NOTE — ED Provider Notes (Signed)
CSN: [id number]     Arrival date & time 09/04/14  2238 History   First MD Initiated Contact [phone] Patient 09/05/14 0117     Chief Complaint  Patient presents with  . Cough     (Consider location/radiation/quality/duration/timing/severity/associated sxs/prior Treatment) HPI Comments: Patient is a 43 year old female with history of type 1 diabetes and hypertension. She presents today with complaints of persistent cough for the past 2 months. She states at the end of September she had a bad upper respiratory infection and has never completely improved. She has had a nagging cough that has been nonproductive. She denies fevers or chills.  Patient is a 43 y.o. female presenting with cough. The history is provided by the patient.  Cough Cough characteristics:  Non-productive Severity:  Moderate Onset quality:  Gradual Duration: 2 months. Timing:  Constant Progression:  Unchanged Chronicity:  New Relieved by:  Nothing   Past Medical History  Diagnosis Date  . Diabetes mellitus without complication   . Hypertension   . History of herpes genitalis   . HTN (hypertension)   . Cholelithiases 01/25/2014  . Fatty liver 01/25/2014   Past Surgical History  Procedure Laterality Date  . Partial hysterectomy     Family History  Problem Relation Age of Onset  . Hypertension Mother   . Diabetes Father   . Hypertension Sister   . Cancer Maternal Aunt     ovarian  . Cancer Maternal Uncle     lung  . Diabetes Maternal Grandmother   . Diabetes Maternal Grandfather   . Heart disease Neg Hx   . Diabetes Cousin    History  Substance Use Topics  . Smoking status: Never Smoker   . Smokeless tobacco: Never Used  . Alcohol Use: Yes     Comment: social   OB History    No data available     Review of Systems  Respiratory: Positive for cough.   All other systems reviewed and are negative.     Allergies  Review of patient's allergies indicates no known allergies.  Home Medications    Prior to Admission medications   Medication Sig Start Date End Date Taking? Authorizing Provider  ACCU-CHEK FASTCLIX LANCETS MISC USE TO CHECK BLOOD SUGAR TWICE DAILY 06/03/13   Sandford CrazeMelissa O'Sullivan, NP  aspirin EC 81 MG tablet Take 81 mg by mouth daily.    Historical Provider, MD  escitalopram (LEXAPRO) 10 MG tablet 1/2 tab by mouth daily for 1 week, then increase to a full tab daily on week 2 12/04/13   Sandford CrazeMelissa O'Sullivan, NP  ezetimibe (ZETIA) 10 MG tablet Take 1 tablet (10 mg total) by mouth daily. 07/21/13   Sandford CrazeMelissa O'Sullivan, NP  gabapentin (NEURONTIN) 300 MG capsule 300 mg. One caps by mouth every morning and 2 caps at bedtime.    Historical Provider, MD  Glucose Blood (BLOOD GLUCOSE TEST STRIPS) STRP Use to check blood sugar twice a day. DX 250.00 02/28/13   Sandford CrazeMelissa O'Sullivan, NP  insulin aspart (NOVOLOG FLEXPEN) 100 UNIT/ML FlexPen Inject 6 units at breakfast, 6 units at lunch and 10 units at supper and per sliding scale    Historical Provider, MD  Insulin Glargine (LANTUS SOLOSTAR) 100 UNIT/ML Solostar Pen Inject 30 units SQ QHS 12/04/13   Sandford CrazeMelissa O'Sullivan, NP  Insulin Pen Needle 31G X 5 MM MISC Use 4x a day 12/29/13   Carlus Pavlovristina Gherghe, MD  lisinopril (PRINIVIL,ZESTRIL) 10 MG tablet Take 1 tablet (10 mg total) by mouth daily. 12/15/13   Melissa  Peggyann Juba'Sullivan, NP  meloxicam (MOBIC) 7.5 MG tablet Take 1 tablet (7.5 mg total) by mouth daily. 08/25/13   Sandford CrazeMelissa O'Sullivan, NP  metFORMIN (GLUCOPHAGE-XR) 500 MG 24 hr tablet Take 2 tablets with breakfast and 2 with dinner. 11/17/13   Carlus Pavlovristina Gherghe, MD  omeprazole (PRILOSEC) 40 MG capsule Take 1 capsule (40 mg total) by mouth daily. 04/11/13   Sandford CrazeMelissa O'Sullivan, NP  rosuvastatin (CRESTOR) 40 MG tablet Take 1 tablet (40 mg total) by mouth daily. 01/24/14   Sandford CrazeMelissa O'Sullivan, NP  valACYclovir (VALTREX) 1000 MG tablet TAKE 1 TABLET BY MOUTH EVERY DAY 03/16/14   Sandford CrazeMelissa O'Sullivan, NP  zolpidem (AMBIEN) 5 MG tablet Take 5 mg by mouth at bedtime as needed.  01/17/13   Historical Provider, MD   BP 135/87 mmHg  Pulse 96  Temp(Src) 98 F (36.7 C) (Oral)  Resp 18  Wt 155 lb (70.308 kg)  SpO2 99% Physical Exam  Constitutional: She is oriented to person, place, and time. She appears well-developed and well-nourished. No distress.  HENT:  Head: Normocephalic and atraumatic.  Mouth/Throat: Oropharynx is clear and moist.  Neck: Normal range of motion. Neck supple.  Cardiovascular: Normal rate and regular rhythm.  Exam reveals no gallop and no friction rub.   No murmur heard. Pulmonary/Chest: Effort normal and breath sounds normal. No respiratory distress. She has no wheezes.  Abdominal: Soft. Bowel sounds are normal. She exhibits no distension. There is no tenderness.  Musculoskeletal: Normal range of motion. She exhibits no edema.  Neurological: She is alert and oriented to person, place, and time.  Skin: Skin is warm and dry. She is not diaphoretic.  Nursing note and vitals reviewed.   ED Course  Procedures (including critical care time) Labs Review Labs Reviewed  CBG MONITORING, ED    Imaging Review No results found.   EKG Interpretation None      MDM   Final diagnoses:  None    Due to persistent cough for the past several weeks and no relief with over-the-counter medications, I will treat as acute bronchitis with Zithromax and to us and neck. She has an appointment with her doctor in the next few days and I have advised her to keep this for follow-up for this particular illness. Is to return if her symptoms worsen or change.    Geoffery Lyonsouglas Jessika Rothery, MD 09/05/14 0130

## 2014-09-08 LAB — CBG MONITORING, ED: Glucose-Capillary: 304 mg/dL — ABNORMAL HIGH (ref 70–99)

## 2014-09-10 ENCOUNTER — Ambulatory Visit (INDEPENDENT_AMBULATORY_CARE_PROVIDER_SITE_OTHER): Payer: PRIVATE HEALTH INSURANCE | Admitting: Family

## 2014-09-10 ENCOUNTER — Encounter: Payer: Self-pay | Admitting: Family

## 2014-09-10 VITALS — BP 130/88 | HR 62 | Temp 98.1°F | Resp 16 | Ht 62.5 in | Wt 156.6 lb

## 2014-09-10 DIAGNOSIS — I10 Essential (primary) hypertension: Secondary | ICD-10-CM

## 2014-09-10 DIAGNOSIS — R454 Irritability and anger: Secondary | ICD-10-CM

## 2014-09-10 DIAGNOSIS — R109 Unspecified abdominal pain: Secondary | ICD-10-CM

## 2014-09-10 DIAGNOSIS — E785 Hyperlipidemia, unspecified: Secondary | ICD-10-CM

## 2014-09-10 DIAGNOSIS — R350 Frequency of micturition: Secondary | ICD-10-CM

## 2014-09-10 DIAGNOSIS — J209 Acute bronchitis, unspecified: Secondary | ICD-10-CM | POA: Insufficient documentation

## 2014-09-10 DIAGNOSIS — IMO0002 Reserved for concepts with insufficient information to code with codable children: Secondary | ICD-10-CM

## 2014-09-10 DIAGNOSIS — E1165 Type 2 diabetes mellitus with hyperglycemia: Secondary | ICD-10-CM

## 2014-09-10 LAB — POCT URINALYSIS DIPSTICK
BILIRUBIN UA: NEGATIVE
Glucose, UA: NEGATIVE
Ketones, UA: NEGATIVE
Leukocytes, UA: NEGATIVE
Nitrite, UA: NEGATIVE
Protein, UA: NEGATIVE
RBC UA: NEGATIVE
Urobilinogen, UA: NEGATIVE
pH, UA: 6

## 2014-09-10 LAB — BASIC METABOLIC PANEL
BUN: 11 mg/dL (ref 6–23)
CO2: 26 meq/L (ref 19–32)
Calcium: 9.6 mg/dL (ref 8.4–10.5)
Chloride: 103 mEq/L (ref 96–112)
Creatinine, Ser: 0.7 mg/dL (ref 0.4–1.2)
GFR: 123.8 mL/min (ref >60.00)
Glucose, Bld: 139 mg/dL — ABNORMAL HIGH (ref 70–99)
Potassium: 4.6 mEq/L (ref 3.5–5.1)
SODIUM: 138 meq/L (ref 135–145)

## 2014-09-10 LAB — MICROALBUMIN / CREATININE URINE RATIO
Creatinine,U: 168.9 mg/dL
MICROALB/CREAT RATIO: 0.4 mg/g (ref 0.0–30.0)
Microalb, Ur: 0.7 mg/dL (ref 0.0–1.9)

## 2014-09-10 LAB — HEMOGLOBIN A1C: Hgb A1c MFr Bld: 9.8 % — ABNORMAL HIGH (ref 4.6–6.5)

## 2014-09-10 MED ORDER — INSULIN PEN NEEDLE 31G X 5 MM MISC: Status: DC

## 2014-09-10 MED ORDER — ESCITALOPRAM OXALATE 10 MG PO TABS: ORAL_TABLET | ORAL | Status: DC

## 2014-09-10 MED ORDER — BLOOD GLUCOSE TEST VI STRP: ORAL_STRIP | Status: AC

## 2014-09-10 MED ORDER — INSULIN GLARGINE 100 UNIT/ML SOLOSTAR PEN: PEN_INJECTOR | SUBCUTANEOUS | Status: DC

## 2014-09-10 MED ORDER — ACCU-CHEK FASTCLIX LANCETS MISC: Status: AC

## 2014-09-10 MED ORDER — ROSUVASTATIN CALCIUM 40 MG PO TABS: 40.0000 mg | ORAL_TABLET | Freq: Every day | ORAL | Status: DC

## 2014-09-10 MED ORDER — OMEPRAZOLE 40 MG PO CPDR
40.0000 mg | DELAYED_RELEASE_CAPSULE | Freq: Every day | ORAL | Status: DC
Start: 2014-09-10 — End: 2015-11-29

## 2014-09-10 MED ORDER — INSULIN ASPART 100 UNIT/ML FLEXPEN: PEN_INJECTOR | SUBCUTANEOUS | Status: DC

## 2014-09-10 MED ORDER — LISINOPRIL 10 MG PO TABS: 10.0000 mg | ORAL_TABLET | Freq: Every day | ORAL | Status: DC

## 2014-09-10 MED ORDER — ZOLPIDEM TARTRATE 5 MG PO TABS: 5.0000 mg | ORAL_TABLET | Freq: Every evening | ORAL | Status: DC | PRN

## 2014-09-10 MED ORDER — METFORMIN HCL ER 500 MG PO TB24: ORAL_TABLET | ORAL | Status: DC

## 2014-09-10 MED ORDER — GABAPENTIN 300 MG PO CAPS: ORAL_CAPSULE | ORAL | Status: DC

## 2014-09-10 MED ORDER — VALACYCLOVIR HCL 1 G PO TABS: 1000.0000 mg | ORAL_TABLET | Freq: Every day | ORAL | Status: DC

## 2014-09-10 MED ORDER — CIPROFLOXACIN HCL 250 MG PO TABS: 250.0000 mg | ORAL_TABLET | Freq: Two times a day (BID) | ORAL | Status: DC

## 2014-09-10 NOTE — Assessment & Plan Note (Signed)
Resume crestor.  Never started zetia. Plan to repeat flp in 3 months and will adjust accordingly.

## 2014-09-10 NOTE — Assessment & Plan Note (Signed)
Stable on current meds.  Continue same. 

## 2014-09-10 NOTE — Progress Notes (Signed)
Subjective:    Patient ID: Breanna Avery, female    DOB: 04-06-1971, 43 y.o.   MRN: [id number]  HPI  [name] is a 43 yr old female who presents today for follow up.  1) HTN- She denies cp. Notes mild sob with her cough.   BP Readings from Last 3 Encounters:  09/10/14 130/88  09/05/14 120/65  01/23/14 124/82   2) Hyperlipidemia- She has not been taking crestor due to lack of insurance.  Lab Results  Component Value Date   CHOL 256* 01/23/2014   HDL 30* 01/23/2014   LDLCALC 183* 01/23/2014   TRIG 214* 01/23/2014   CHOLHDL 8.5 01/23/2014   3) Irritability-maintained on lexapro. Reports mood is "decent." Denies tearfulness.  Reports that she ran out of gabapentin and Remus Lofflerambien has not been sleeping as well.    4) DM2- on metformin, novolog and lantus. She is scheduled for follow up with Dr. Elvera Avery on 12/2. Reports taht her home sugars have been "ok."  She has been without insurance due to job change and now has insurance again.  Lab Results  Component Value Date   HGBA1C 10.2* 10/23/2013   HGBA1C 8.6* 07/17/2013   HGBA1C 8.1* 02/28/2013   Lab Results  Component Value Date   MICROALBUR 0.79 07/17/2013   LDLCALC 183* 01/23/2014   CREATININE 0.72 01/23/2014   5) Urinary frequency- x 4 days,has pain in right flank. One episode of dysuria.   Bronchitis-  Still coughing, just completed zithromax.  Reports enlarged tender LN behind left ear which is becoming smaller.  Review of Systems See HPI  Past Medical History  Diagnosis Date  . Diabetes mellitus without complication   . Hypertension   . History of herpes genitalis   . HTN (hypertension)   . Cholelithiases 01/25/2014  . Fatty liver 01/25/2014    History   Social History  . Marital Status: Single    Spouse Name: N/A    Number of Children: 3  . Years of Education: N/A   Occupational History  .     Social History Main Topics  . Smoking status: Never Smoker   . Smokeless tobacco: Never Used  . Alcohol Use:  Yes     Comment: social  . Drug Use: Not on file  . Sexual Activity: Not on file   Other Topics Concern  . Not on file   Social History Narrative   Single   3 children- ages 2222 (son), 5920 (daughter) and [address]16 (son)   Younger two children at home   She is a [employer]   Completed 1 year of college   Enjoys movies, relaxing, bowling    Past Surgical History  Procedure Laterality Date  . Partial hysterectomy      Family History  Problem Relation Age of Onset  . Hypertension Mother   . Diabetes Father   . Hypertension Sister   . Cancer Maternal Aunt     ovarian  . Cancer Maternal Uncle     lung  . Diabetes Maternal Grandmother   . Diabetes Maternal Grandfather   . Heart disease Neg Hx   . Diabetes Cousin     No Known Allergies  Current Outpatient Prescriptions on File Prior to Visit  Medication Sig Dispense Refill  . aspirin EC 81 MG tablet Take 81 mg by mouth daily.    Breanna Avery. ezetimibe (ZETIA) 10 MG tablet Take 1 tablet (10 mg total) by mouth daily. 30 tablet 3  .  meloxicam (MOBIC) 7.5 MG tablet Take 1 tablet (7.5 mg total) by mouth daily. 14 tablet 0  . chlorpheniramine-HYDROcodone (TUSSIONEX PENNKINETIC ER) 10-8 MG/5ML LQCR Take 5 mLs by mouth every 12 (twelve) hours as needed for cough. (Patient not taking: Reported on 09/10/2014) 115 mL 0   No current facility-administered medications on file prior to visit.    BP 130/88 mmHg  Pulse 62  Temp(Src) 98.1 F (36.7 C) (Oral)  Resp 16  Ht 5' 2.5" (1.588 m)  Wt 156 lb 9.6 oz (71.033 kg)  BMI 28.17 kg/m2  SpO2 99%       Objective:   Physical Exam  Constitutional: She is oriented to person, place, and time. She appears well-developed and well-nourished. No distress.  HENT:  Head: Normocephalic and atraumatic.  Neck:  Tender left posterior auricular LN <1cm  Cardiovascular: Normal rate and regular rhythm.   No murmur heard. Pulmonary/Chest: Effort normal and breath sounds normal. No respiratory  distress. She has no wheezes. She has no rales. She exhibits no tenderness.  Abdominal: There is no CVA tenderness.  Mild right lateral flank tenderness to palpation.   Musculoskeletal: She exhibits no edema.  Neurological: She is alert and oriented to person, place, and time.  Psychiatric: She has a normal mood and affect. Her behavior is normal. Judgment and thought content normal.          Assessment & Plan:

## 2014-09-10 NOTE — Assessment & Plan Note (Signed)
Fair control per pt.  Keep upcoming apt with endo. Obtain a1c and urine microalbumin today.

## 2014-09-10 NOTE — Patient Instructions (Addendum)
Start cipro for possible urinary tract infection. Call if cough worsens or if it does not improve in next 1 week.   Follow up in 3 months, sooner if problems/concerns.

## 2014-09-10 NOTE — Assessment & Plan Note (Addendum)
Pt continues to cough. Chest is clear on exam today. She has rx for tussionex which she can take HS and advised pt to use sugar free non-drowsy cough med such as delsym during the day. If not improved in 1 week, pt instructed to let me know.

## 2014-09-10 NOTE — Assessment & Plan Note (Signed)
About the same, but pt has been out of lexapro x 1 month, resume

## 2014-09-10 NOTE — Assessment & Plan Note (Signed)
UA is unremarkable, but will plan to send for culture and start empiric cipro given sx's.

## 2014-09-11 ENCOUNTER — Telehealth: Payer: Self-pay | Admitting: Family

## 2014-09-11 ENCOUNTER — Encounter: Payer: Self-pay | Admitting: Family

## 2014-09-11 LAB — URINE CULTURE
Colony Count: NO GROWTH
ORGANISM ID, BACTERIA: NO GROWTH

## 2014-09-11 NOTE — Telephone Encounter (Signed)
Notified pt and she voices understanding. 

## 2014-09-11 NOTE — Telephone Encounter (Signed)
Sugar remains uncontrolled. Keep follow up with Dr. Elvera LennoxGherghe.  Urine culture negative, d/c cipro.

## 2014-09-17 ENCOUNTER — Encounter: Payer: Self-pay | Admitting: Internal Medicine

## 2014-09-17 ENCOUNTER — Ambulatory Visit (INDEPENDENT_AMBULATORY_CARE_PROVIDER_SITE_OTHER): Payer: PRIVATE HEALTH INSURANCE | Admitting: Internal Medicine

## 2014-09-17 ENCOUNTER — Other Ambulatory Visit: Payer: Self-pay | Admitting: *Deleted

## 2014-09-17 VITALS — BP 114/68 | HR 86 | Temp 98.1°F | Resp 12 | Wt 158.0 lb

## 2014-09-17 DIAGNOSIS — IMO0002 Reserved for concepts with insufficient information to code with codable children: Secondary | ICD-10-CM

## 2014-09-17 DIAGNOSIS — E1165 Type 2 diabetes mellitus with hyperglycemia: Secondary | ICD-10-CM

## 2014-09-17 MED ORDER — INSULIN GLARGINE 100 UNIT/ML SOLOSTAR PEN: PEN_INJECTOR | SUBCUTANEOUS | Status: DC

## 2014-09-17 MED ORDER — INSULIN ASPART 100 UNIT/ML FLEXPEN: PEN_INJECTOR | SUBCUTANEOUS | Status: DC

## 2014-09-17 MED ORDER — METFORMIN HCL ER 500 MG PO TB24: ORAL_TABLET | ORAL | Status: DC

## 2014-09-17 NOTE — Progress Notes (Signed)
Patient ID: Breanna Avery, female   DOB: 1971/06/13, 43 y.o.   MRN: [id number]  HPI: Breanna Avery is a 43 y.o.-year-old female, returning for f/u for DM2, dx 12/2010, insulin-dependent since dx, uncontrolled, with complications (PN). Last visit 8 mo ago.  Has a new insurance.  Last hemoglobin A1c was: Lab Results  Component Value Date   HGBA1C 9.8* 09/10/2014   HGBA1C 10.2* 10/23/2013   HGBA1C 8.6* 07/17/2013   Pt is on a regimen of: - Lantus 30 units qhs >> ran out of it 3 mo ago - NovoLog insulin - added 11/2013:  6 units with breakfast - takes it 7/7  6 units with lunch - takes it 3/7  8 units with dinner - takes it 7/7 - Novolog SSI units tid ac - not using  - 150- 165: + 1 unit  - 166- 180: + 2 units  - 181- 195: + 3 units  - 196- 210: + 4 units  - >200: + 5 units  She was on Metformin >> diarrhea >> started Metformin XR in 11/2013 - 2000 mg bid.  Pt checks her sugars 2x a day and they are improved (no log, no meter): - am: 150-200 >> 130-180 >> 120-250s - 2h after b'fast: n/c  - before lunch: low 200s >> 90-196 >> 150s - 2h after lunch: n/c - before dinner: 280 >> 121-200 >> 240-250s  - 2h after dinner: n/c - bedtime: upper 200s-300s >> 209-309 >> n/c - nighttime: n/c  No lows. Lowest sugar was 115; she has hypoglycemia awareness at 70.  Highest sugar was 300s.  Pt's meals are: - Breakfast: toast wheat, eggs - Lunch: sandwich, salad, yoghurt - Dinner: baked chicken; mac and cheese, veggies - Snacks: one - chips Drinks some some diet sodas, not every day.  She walks for exercise - 30 min at work. She started to go to the gym 3x a week.  She worked 3 pm-11 pm >> works first shift  - no CKD, last BUN/creatinine:  Lab Results  Component Value Date   BUN 11 09/10/2014   CREATININE 0.7 09/10/2014  On lisinopril. Lab Results  Component Value Date   MICRALBCREAT 0.4 09/10/2014   - last set of lipids: Lab Results  Component Value Date   CHOL 256*  01/23/2014   HDL 30* 01/23/2014   LDLCALC 183* 01/23/2014   TRIG 214* 01/23/2014   CHOLHDL 8.5 01/23/2014  On Zetia, Crestor.  On ASA.  - last eye exam was in 12/2013. No DR.  - + numbness and tingling in her feet. She is on Neurontin 1 in am and 2 in hs, but she only takes it in hs as she gets sleepy in am if she takes.   I reviewed pt's medications, allergies, PMH, social hx, family hx and no changes required, except as mentioned above.  ROS: Constitutional: + weight loss, no fatigue Eyes: no blurry vision, no xerophthalmia ENT: no sore throat, no nodules palpated in throat, no dysphagia/odynophagia, no hoarseness Cardiovascular: no CP/SOB/palpitations/leg swelling Respiratory: + cough/+ SOB/+ wheezing Gastrointestinal: no N/V/D/C Musculoskeletal: no muscle/joint aches Skin: no rashes Neurological: no tremors/numbness/tingling/dizziness  PE: BP 114/68 mmHg  Pulse 86  Temp(Src) 98.1 F (36.7 C) (Oral)  Resp 12  Wt 158 lb (71.668 kg)  SpO2 98% Wt Readings from Last 3 Encounters:  09/17/14 158 lb (71.668 kg)  09/10/14 156 lb 9.6 oz (71.033 kg)  09/04/14 155 lb (70.308 kg)   Constitutional: overweight, in NAD Eyes: PERRLA, EOMI, no  exophthalmos ENT: moist mucous membranes, no thyromegaly, no cervical lymphadenopathy Cardiovascular: RRR, No MRG Respiratory: CTA B Gastrointestinal: abdomen soft, NT, ND, BS+ Musculoskeletal: no deformities, strength intact in all 4 Skin: moist, warm, no rashes, + acanthosis nigricans on neck Neurological: no tremor with outstretched hands, DTR normal in all 4  ASSESSMENT: 1. DM2, insulin-dependent, uncontrolled, with complications - PN  PLAN:  1. Patient with long-standing, uncontrolled diabetes, with poor control 2/2 noncompliance with meds and checks. - I suggested to:    Patient Instructions  Please change the insulin regimen as follows: - restart Lantus 20 units at bedtime - continue NovoLog insulin with meals:  6 units with  breakfast   6 units with lunch  10 units with dinner If you see that your sugars drop after exercise in am >> decrease Novolog with b'fast that day by 2 units. Continue sliding scale of NovoLog: - 150- 165: + 1 unit  - 166- 180: + 2 units  - 181- 195: + 3 units  - 196- 210: + 4 units  - >200: + 5 units  Continue Metfomin XR 2000 mg daily.   - refilled all Rxs - continue checking sugars at different times of the day - check 3-4 times a day, rotating checks - given new sugar log sheets - advised for yearly eye exams - she is up to date - Return to clinic in 6 weeks with sugar log

## 2014-09-17 NOTE — Patient Instructions (Signed)
Please change the insulin regimen as follows: - restart Lantus 20 units at bedtime - continue NovoLog insulin with meals:  6 units with breakfast   6 units with lunch  10 units with dinner If you see that your sugars drop after exercise in am >> decrease Novolog with b'fast that day by 2 units. Continue sliding scale of NovoLog: - 150- 165: + 1 unit  - 166- 180: + 2 units  - 181- 195: + 3 units  - 196- 210: + 4 units  - >200: + 5 units  Continue Metfomin XR 2000 mg daily.

## 2014-09-20 ENCOUNTER — Telehealth: Payer: Self-pay | Admitting: Family

## 2014-09-20 MED ORDER — FLUCONAZOLE 150 MG PO TABS: 150.0000 mg | ORAL_TABLET | Freq: Once | ORAL | Status: DC

## 2014-09-20 NOTE — Telephone Encounter (Signed)
Diflucan sent to pharmacy.  Take as directed.

## 2014-09-20 NOTE — Telephone Encounter (Signed)
Caller name: Khia Relation to pt: self Call back number: [phone] [facility]: [facility] [address]  Reason for call:   Patient has a yeast infection and would like something called in

## 2014-09-20 NOTE — Telephone Encounter (Signed)
Patient was put on Cipro on 09/10/2014. Would you still recommend she needs an appt or can diflucan be called in for her. Please advise. JG//CMA

## 2014-09-21 NOTE — Telephone Encounter (Signed)
Called pt and unable to leave message. JG//CMA

## 2014-09-26 ENCOUNTER — Telehealth: Payer: Self-pay | Admitting: *Deleted

## 2014-09-26 NOTE — Telephone Encounter (Signed)
[facility]. Left detailed message for pt stating we are discarding request. If [facility] she should have them forward request to her endocrinologist (Dr Elvera LennoxGherghe).

## 2014-11-05 ENCOUNTER — Ambulatory Visit: Payer: PRIVATE HEALTH INSURANCE | Admitting: Internal Medicine

## 2014-12-10 ENCOUNTER — Ambulatory Visit: Payer: PRIVATE HEALTH INSURANCE | Admitting: Family

## 2014-12-13 ENCOUNTER — Telehealth: Payer: Self-pay | Admitting: Family

## 2014-12-13 ENCOUNTER — Encounter: Payer: Self-pay | Admitting: Family

## 2014-12-13 NOTE — Telephone Encounter (Signed)
Charge request forwarded °

## 2014-12-13 NOTE — Telephone Encounter (Signed)
Charge no show fee, send recall letter.

## 2014-12-13 NOTE — Telephone Encounter (Signed)
Pt was no show for 3 month follow up on 12/10/14- has not been rescheduled. Would you like me to reach out to her? No show fee? Letter has been sent.

## 2014-12-18 LAB — HM DIABETES EYE EXAM

## 2015-01-03 ENCOUNTER — Encounter: Payer: Self-pay | Admitting: Family

## 2015-03-08 ENCOUNTER — Ambulatory Visit: Payer: PRIVATE HEALTH INSURANCE | Admitting: Medical

## 2015-03-08 ENCOUNTER — Encounter: Payer: Self-pay | Admitting: Medical

## 2015-03-08 ENCOUNTER — Ambulatory Visit (INDEPENDENT_AMBULATORY_CARE_PROVIDER_SITE_OTHER): Payer: No Typology Code available for payment source | Admitting: Medical

## 2015-03-08 VITALS — BP 130/80 | HR 94 | Temp 98.3°F | Ht 62.5 in | Wt 166.0 lb

## 2015-03-08 DIAGNOSIS — R197 Diarrhea, unspecified: Secondary | ICD-10-CM

## 2015-03-08 DIAGNOSIS — R1010 Upper abdominal pain, unspecified: Secondary | ICD-10-CM | POA: Diagnosis not present

## 2015-03-08 LAB — COMPREHENSIVE METABOLIC PANEL
ALK PHOS: 77 U/L (ref 39–117)
ALT: 25 U/L (ref 0–35)
AST: 20 U/L (ref 0–37)
Albumin: 4 g/dL (ref 3.5–5.2)
BILIRUBIN TOTAL: 0.3 mg/dL (ref 0.2–1.2)
BUN: 11 mg/dL (ref 6–23)
CO2: 25 mEq/L (ref 19–32)
CREATININE: 0.68 mg/dL (ref 0.50–1.10)
Calcium: 9.9 mg/dL (ref 8.4–10.5)
Chloride: 98 mEq/L (ref 96–112)
GLUCOSE: 255 mg/dL — AB (ref 70–99)
POTASSIUM: 4.1 meq/L (ref 3.5–5.3)
Sodium: 132 mEq/L — ABNORMAL LOW (ref 135–145)
TOTAL PROTEIN: 7.3 g/dL (ref 6.0–8.3)

## 2015-03-08 LAB — CBC WITH DIFFERENTIAL/PLATELET
Basophils Absolute: 0 10*3/uL (ref 0.0–0.1)
Basophils Relative: 0 % (ref 0–1)
EOS ABS: 0.2 10*3/uL (ref 0.0–0.7)
EOS PCT: 2 % (ref 0–5)
HCT: 37.1 % (ref 36.0–46.0)
HEMOGLOBIN: 12.6 g/dL (ref 12.0–15.0)
LYMPHS ABS: 4.6 10*3/uL — AB (ref 0.7–4.0)
Lymphocytes Relative: 48 % — ABNORMAL HIGH (ref 12–46)
MCH: 31.1 pg (ref 26.0–34.0)
MCHC: 34 g/dL (ref 30.0–36.0)
MCV: 91.6 fL (ref 78.0–100.0)
MONO ABS: 0.5 10*3/uL (ref 0.1–1.0)
MPV: 11 fL (ref 8.6–12.4)
Monocytes Relative: 5 % (ref 3–12)
NEUTROS ABS: 4.3 10*3/uL (ref 1.7–7.7)
Neutrophils Relative %: 45 % (ref 43–77)
Platelets: 297 10*3/uL (ref 150–400)
RBC: 4.05 MIL/uL (ref 3.87–5.11)
RDW: 14.2 % (ref 11.5–15.5)
WBC: 9.5 10*3/uL (ref 4.0–10.5)

## 2015-03-08 LAB — LIPASE: LIPASE: 43 U/L (ref 0–75)

## 2015-03-08 MED ORDER — RANITIDINE HCL 150 MG PO TABS: 150.0000 mg | ORAL_TABLET | Freq: Two times a day (BID) | ORAL | Status: DC

## 2015-03-08 MED ORDER — HYOSCYAMINE SULFATE ER 0.375 MG PO TB12: 0.3750 mg | ORAL_TABLET | Freq: Two times a day (BID) | ORAL | Status: DC

## 2015-03-08 MED ORDER — ONDANSETRON 8 MG PO TBDP: 8.0000 mg | ORAL_TABLET | Freq: Three times a day (TID) | ORAL | Status: DC | PRN

## 2015-03-08 NOTE — Addendum Note (Signed)
Addended by: Silvio PateHOMPSON, Jashun Puertas D on: 03/08/2015 03:51 PM   Modules accepted: Orders

## 2015-03-08 NOTE — Patient Instructions (Addendum)
Pain of upper abdomen With diarrhea. Bland diet guidelines. Hydrate well. Get cbc, cmp, lipase, h. pylori, today.  Rx ranitidine, zofran for vomiting, and levid.  Get stool panel studies.  Follow up in 7 days or as needed.  Stop immodium and stop pepto.      If  abd pain/symptoms  increases or worsen over long weekend then ED evaluation.

## 2015-03-08 NOTE — Assessment & Plan Note (Signed)
With diarrhea. Bland diet guidelines. Hydrate well. Get cbc, cmp, lipase, h. pylori, today.  Rx ranitidine, zofran for vomiting, and levid.  Get stool panel studies.  Follow up in 7 days or as needed.  Stop immodium and stop pepto.

## 2015-03-08 NOTE — Progress Notes (Signed)
Pre visit review using our clinic review tool, if applicable. No additional management support is needed unless otherwise documented below in the visit note. 

## 2015-03-08 NOTE — Addendum Note (Signed)
Addended by: Silvio PateHOMPSON, Lonnie Reth D on: 03/08/2015 03:52 PM   Modules accepted: Orders

## 2015-03-08 NOTE — Progress Notes (Signed)
   Subjective:    Patient ID: Windy KalataEricka Scheper, female    DOB: June 23, 1971, 44 y.o.   MRN: [id number]  HPI  Pt in with about one month and half of stomach pain. Pain on and off. After eating but other times also. Pain mild to moderate. No alcohol. Pt tried peptobismal. Also some loose stools about 5 loose stools a day. Loose stools mostly after eating. This has been going on for one month. No hx of UC or chrones disease. No fevers, no chills or sweats. After thinks maybe was some food that she ate 1 month ago but she can't remember.  Pt is on metformin. This not new.   Nausea and vomting is usually much rare.4 times past month. Pt had partially hystorectomy in 2002.    Review of Systems  Constitutional: Negative for fever, chills and fatigue.  Respiratory: Negative for cough, choking and wheezing.   Cardiovascular: Negative for chest pain and palpitations.  Gastrointestinal: Positive for abdominal pain and diarrhea. Negative for nausea, vomiting, constipation, blood in stool, abdominal distention, anal bleeding and rectal pain.  Genitourinary: Negative.   Musculoskeletal: Negative for back pain.  Skin: Negative for rash.  Neurological: Negative for dizziness, syncope, speech difficulty, weakness, light-headedness and numbness.  Hematological: Negative for adenopathy.  Psychiatric/Behavioral: Negative for confusion and sleep disturbance.       Objective:   Physical Exam  General Appearance- Not in acute distress.  HEENT Eyes- Scleraeral/Conjuntiva-bilat- Not Yellow. Mouth & Throat- Normal.  Chest and Lung Exam Auscultation: Breath sounds:-Normal. Adventitious sounds:- No Adventitious sounds.  Cardiovascular Auscultation:Rythm - Regular. Heart Sounds -Normal heart sounds.  Abdomen Inspection:-Inspection Normal.  Palpation/Perucssion: Palpation and Percussion of the abdomen reveal- epigastric  Tender moderate, No Rebound tenderness, No rigidity(Guarding) and No Palpable  abdominal masses.  Liver:-Normal.  Spleen:- Normal.   Back- no cva tenderness.        Assessment & Plan:

## 2015-03-11 LAB — H. PYLORI BREATH TEST: H. PYLORI BREATH TEST: NOT DETECTED

## 2015-03-14 LAB — OVA AND PARASITE EXAMINATION: OP: NONE SEEN

## 2015-03-14 LAB — CLOSTRIDIUM DIFFICILE BY PCR: Toxigenic C. Difficile by PCR: NOT DETECTED

## 2015-03-17 LAB — STOOL CULTURE

## 2015-05-08 ENCOUNTER — Telehealth: Payer: Self-pay | Admitting: Behavioral Health

## 2015-05-08 NOTE — Telephone Encounter (Signed)
Unable to reach patient at time of Pre-Visit Call.  Left message for patient to return call when available.    

## 2015-05-10 ENCOUNTER — Ambulatory Visit (INDEPENDENT_AMBULATORY_CARE_PROVIDER_SITE_OTHER): Payer: PRIVATE HEALTH INSURANCE | Admitting: Family

## 2015-05-10 ENCOUNTER — Telehealth: Payer: Self-pay | Admitting: *Deleted

## 2015-05-10 ENCOUNTER — Encounter: Payer: Self-pay | Admitting: Family

## 2015-05-10 VITALS — BP 120/70 | HR 75 | Temp 97.8°F | Resp 16 | Ht 62.0 in | Wt 162.2 lb

## 2015-05-10 DIAGNOSIS — J4520 Mild intermittent asthma, uncomplicated: Secondary | ICD-10-CM

## 2015-05-10 DIAGNOSIS — Z0000 Encounter for general adult medical examination without abnormal findings: Secondary | ICD-10-CM

## 2015-05-10 DIAGNOSIS — IMO0002 Reserved for concepts with insufficient information to code with codable children: Secondary | ICD-10-CM

## 2015-05-10 DIAGNOSIS — J45909 Unspecified asthma, uncomplicated: Secondary | ICD-10-CM | POA: Insufficient documentation

## 2015-05-10 DIAGNOSIS — E1165 Type 2 diabetes mellitus with hyperglycemia: Secondary | ICD-10-CM

## 2015-05-10 DIAGNOSIS — E785 Hyperlipidemia, unspecified: Secondary | ICD-10-CM | POA: Diagnosis not present

## 2015-05-10 LAB — TSH: TSH: 1.18 u[IU]/mL (ref 0.35–4.50)

## 2015-05-10 LAB — URINALYSIS, ROUTINE W REFLEX MICROSCOPIC
BILIRUBIN URINE: NEGATIVE
HGB URINE DIPSTICK: NEGATIVE
Ketones, ur: NEGATIVE
Leukocytes, UA: NEGATIVE
NITRITE: NEGATIVE
Specific Gravity, Urine: 1.015 (ref 1.000–1.030)
TOTAL PROTEIN, URINE-UPE24: NEGATIVE
URINE GLUCOSE: NEGATIVE
Urobilinogen, UA: 0.2 (ref 0.0–1.0)
pH: 6.5 (ref 5.0–8.0)

## 2015-05-10 LAB — CBC WITH DIFFERENTIAL/PLATELET
Basophils Absolute: 0 10*3/uL (ref 0.0–0.1)
Basophils Relative: 0.4 % (ref 0.0–3.0)
EOS ABS: 0.2 10*3/uL (ref 0.0–0.7)
Eosinophils Relative: 1.9 % (ref 0.0–5.0)
HCT: 41.3 % (ref 36.0–46.0)
Hemoglobin: 13.8 g/dL (ref 12.0–15.0)
LYMPHS ABS: 4.6 10*3/uL — AB (ref 0.7–4.0)
Lymphocytes Relative: 48.9 % — ABNORMAL HIGH (ref 12.0–46.0)
MCHC: 33.3 g/dL (ref 30.0–36.0)
MCV: 94.2 fl (ref 78.0–100.0)
MONO ABS: 0.5 10*3/uL (ref 0.1–1.0)
Monocytes Relative: 5 % (ref 3.0–12.0)
Neutro Abs: 4.2 10*3/uL (ref 1.4–7.7)
Neutrophils Relative %: 43.8 % (ref 43.0–77.0)
Platelets: 322 10*3/uL (ref 150.0–400.0)
RBC: 4.38 Mil/uL (ref 3.87–5.11)
RDW: 13.5 % (ref 11.5–15.5)
WBC: 9.5 10*3/uL (ref 4.0–10.5)

## 2015-05-10 LAB — HEPATIC FUNCTION PANEL
ALK PHOS: 86 U/L (ref 39–117)
ALT: 21 U/L (ref 0–35)
AST: 15 U/L (ref 0–37)
Albumin: 4.3 g/dL (ref 3.5–5.2)
BILIRUBIN TOTAL: 0.4 mg/dL (ref 0.2–1.2)
Bilirubin, Direct: 0.1 mg/dL (ref 0.0–0.3)
TOTAL PROTEIN: 7.9 g/dL (ref 6.0–8.3)

## 2015-05-10 LAB — BASIC METABOLIC PANEL
BUN: 7 mg/dL (ref 6–23)
CO2: 28 meq/L (ref 19–32)
CREATININE: 0.69 mg/dL (ref 0.40–1.20)
Calcium: 9.5 mg/dL (ref 8.4–10.5)
Chloride: 101 mEq/L (ref 96–112)
GFR: 119.3 mL/min (ref >60.00)
GLUCOSE: 135 mg/dL — AB (ref 70–99)
Potassium: 3.7 mEq/L (ref 3.5–5.1)
SODIUM: 136 meq/L (ref 135–145)

## 2015-05-10 LAB — LIPID PANEL
Cholesterol: 242 mg/dL — ABNORMAL HIGH (ref 0–200)
HDL: 36.7 mg/dL — ABNORMAL LOW (ref >39.00)
NonHDL: 205.09
Total CHOL/HDL Ratio: 7
Triglycerides: 202 mg/dL — ABNORMAL HIGH (ref 0.0–149.0)
VLDL: 40.4 mg/dL — ABNORMAL HIGH (ref 0.0–40.0)

## 2015-05-10 LAB — LDL CHOLESTEROL, DIRECT: LDL DIRECT: 163 mg/dL

## 2015-05-10 LAB — MICROALBUMIN / CREATININE URINE RATIO
CREATININE, U: 129.8 mg/dL
MICROALB UR: 1 mg/dL (ref 0.0–1.9)
Microalb Creat Ratio: 0.8 mg/g (ref 0.0–30.0)

## 2015-05-10 LAB — POCT CBG MONITORING: Glucose Fasting, POC: 147 mg/dL — AB (ref 70–99)

## 2015-05-10 MED ORDER — ESCITALOPRAM OXALATE 10 MG PO TABS: ORAL_TABLET | ORAL | Status: DC

## 2015-05-10 MED ORDER — ROSUVASTATIN CALCIUM 40 MG PO TABS: 40.0000 mg | ORAL_TABLET | Freq: Every day | ORAL | Status: DC

## 2015-05-10 MED ORDER — FLUTICASONE-SALMETEROL 100-50 MCG/DOSE IN AEPB: 1.0000 | INHALATION_SPRAY | Freq: Two times a day (BID) | RESPIRATORY_TRACT | Status: DC

## 2015-05-10 MED ORDER — ALBUTEROL SULFATE HFA 108 (90 BASE) MCG/ACT IN AERS
2.0000 | INHALATION_SPRAY | Freq: Four times a day (QID) | RESPIRATORY_TRACT | Status: AC | PRN
Start: 2015-05-10 — End: ?

## 2015-05-10 MED ORDER — GABAPENTIN 300 MG PO CAPS: ORAL_CAPSULE | ORAL | Status: AC

## 2015-05-10 MED ORDER — ZOLPIDEM TARTRATE 5 MG PO TABS: 5.0000 mg | ORAL_TABLET | Freq: Every evening | ORAL | Status: DC | PRN

## 2015-05-10 MED ORDER — EZETIMIBE 10 MG PO TABS: 10.0000 mg | ORAL_TABLET | Freq: Every day | ORAL | Status: DC

## 2015-05-10 MED ORDER — LISINOPRIL 10 MG PO TABS: 10.0000 mg | ORAL_TABLET | Freq: Every day | ORAL | Status: DC

## 2015-05-10 NOTE — Assessment & Plan Note (Signed)
Discussed healthy diet, exercise, weight loss. Obtain routine labs.  

## 2015-05-10 NOTE — Assessment & Plan Note (Signed)
Obtain lipid panel. Continue statin.  

## 2015-05-10 NOTE — Progress Notes (Signed)
Subjective:    Patient ID: Breanna Avery, female    DOB: [birth year], 44 y.o.   MRN: [id number]  HPI  Breanna Avery is a 44 yr old female who presents today for complete physical.  Immunizations: up to date Diet:  Reports that she has improved her diet. Wt Readings from Last 3 Encounters:  05/10/15 162 lb 3.2 oz (73.573 kg)  03/08/15 166 lb (75.297 kg)  09/17/14 158 lb (71.668 kg)  Exercise: walks the track Pap Smear: hysterectomy-  Mammogram: due [facility]- up to date Dental- goes on 8/8   Reports cough/sob, wheezing. Using an albuterol.  Using every 4 hours. This helps her symptoms.  Attributes to the heat. "happens every year around this time."   DM2- 120 this AM.  Had on reading this month >300.  Denies hypoglycemia. Gabapentin once daily most days. Controls neuropathic pain. Reports that she is using lantus 20 units. She is currently seeing endo. Will establish soon with [facility]  Hyperlipidemia- + compliance with lipid meds.  Denies myalgia.    Review of Systems  Constitutional: Negative for unexpected weight change.  HENT: Negative for hearing loss and rhinorrhea.   Eyes: Negative for visual disturbance.  Respiratory: Positive for shortness of breath and wheezing.   Cardiovascular: Negative for chest pain.  Gastrointestinal: Negative for nausea and vomiting.       Occasional diarrhea  Genitourinary: Negative for dysuria and frequency.  Musculoskeletal: Negative for myalgias and arthralgias.  Skin: Negative for rash.  Neurological: Negative for headaches.  Hematological: Negative for adenopathy.  Psychiatric/Behavioral:       Denies depression/anxiety   Past Medical History  Diagnosis Date  . Diabetes mellitus without complication   . Hypertension   . History of herpes genitalis   . HTN (hypertension)   . Cholelithiases 01/25/2014  . Fatty liver 01/25/2014    History   Social History  . Marital Status: Single    Spouse Name: N/A  .  Number of Children: 3  . Years of Education: N/A   Occupational History  .     Social History Main Topics  . Smoking status: Never Smoker   . Smokeless tobacco: Never Used  . Alcohol Use: Yes     Comment: social  . Drug Use: Not on file  . Sexual Activity: Not on file   Other Topics Concern  . Not on file   Social History Narrative   Single   3 children- ages 4 (son), 38 (daughter) and 28 (son)   Younger two children at home   She is a med Best boy at [employer]   Completed 1 year of college   Enjoys movies, relaxing, bowling    Past Surgical History  Procedure Laterality Date  . Partial hysterectomy      Family History  Problem Relation Age of Onset  . Hypertension Mother   . Diabetes Father   . Hypertension Sister   . Cancer Maternal Aunt     ovarian  . Cancer Maternal Uncle     lung  . Diabetes Maternal Grandmother   . Diabetes Maternal Grandfather   . Heart disease Neg Hx   . Diabetes Cousin     No Known Allergies  Current Outpatient Prescriptions on File Prior to Visit  Medication Sig Dispense Refill  . ACCU-CHEK FASTCLIX LANCETS MISC Check sugar twice daily. 102 each 1  . aspirin EC 81 MG tablet Take 81 mg by mouth daily.    Marland Kitchen escitalopram (  LEXAPRO) 10 MG tablet Take one tab by mouth once daily 30 tablet 3  . ezetimibe (ZETIA) 10 MG tablet Take 1 tablet (10 mg total) by mouth daily. 30 tablet 3  . gabapentin (NEURONTIN) 300 MG capsule One caps by mouth every morning and 2 caps at bedtime. 90 capsule 3  . Glucose Blood (BLOOD GLUCOSE TEST STRIPS) STRP Use to check blood sugar twice a day. DX 250.00 100 each 2  . insulin aspart (NOVOLOG FLEXPEN) 100 UNIT/ML FlexPen Inject 6 units at breakfast, 6 units at lunch and 10 units at supper and per sliding scale 15 mL 3  . Insulin Glargine (LANTUS SOLOSTAR) 100 UNIT/ML Solostar Pen Inject 20 units SQ QHS 5 pen 2  . Insulin Pen Needle 31G X 5 MM MISC Use 4x a day 300 each prn  . lisinopril (PRINIVIL,ZESTRIL) 10  MG tablet Take 1 tablet (10 mg total) by mouth daily. 30 tablet 3  . meloxicam (MOBIC) 7.5 MG tablet Take 1 tablet (7.5 mg total) by mouth daily. 14 tablet 0  . metFORMIN (GLUCOPHAGE-XR) 500 MG 24 hr tablet Take 2 tablets with breakfast and 2 with dinner. 120 tablet 3  . omeprazole (PRILOSEC) 40 MG capsule Take 1 capsule (40 mg total) by mouth daily. 30 capsule 3  . rosuvastatin (CRESTOR) 40 MG tablet Take 1 tablet (40 mg total) by mouth daily. 30 tablet 3  . valACYclovir (VALTREX) 1000 MG tablet Take 1 tablet (1,000 mg total) by mouth daily. 30 tablet 3  . zolpidem (AMBIEN) 5 MG tablet Take 1 tablet (5 mg total) by mouth at bedtime as needed. 30 tablet 0   No current facility-administered medications on file prior to visit.    BP 120/70 mmHg  Pulse 75  Temp(Src) 97.8 F (36.6 C) (Oral)  Resp 16  Ht  (1.575 m)  Wt 162 lb 3.2 oz (73.573 kg)  BMI 29.66 kg/m2  SpO2 97%       Objective:   Physical Exam  Physical Exam  Constitutional: She is oriented to person, place, and time. She appears well-developed and well-nourished. No distress.  HENT:  Head: Normocephalic and atraumatic.  Right Ear: Tympanic membrane and ear canal normal.  Left Ear: Tympanic membrane and ear canal normal.  Mouth/Throat: Oropharynx is clear and moist.  Eyes: Pupils are equal, round, and reactive to light. No scleral icterus.  Neck: Normal range of motion. No thyromegaly present.  Cardiovascular: Normal rate and regular rhythm.   No murmur heard. Pulmonary/Chest: Effort normal and breath sounds normal. No respiratory distress. He has no wheezes. She has no rales. She exhibits no tenderness.  Abdominal: Soft. Bowel sounds are normal. He exhibits no distension and no mass. There is no tenderness. There is no rebound and no guarding.  Musculoskeletal: She exhibits no edema.  Lymphadenopathy:    She has no cervical adenopathy.  Neurological: She is alert and oriented to person, place, and time. She  exhibits normal muscle tone. Coordination normal.  Skin: Skin is warm and dry.  Psychiatric: She has a normal mood and affect. Her behavior is normal. Judgment and thought content normal.  Breasts: Examined lying Right: Without masses, retractions, discharge or axillary adenopathy.  Left: Without masses, retractions, discharge or axillary adenopathy.  Inguinal/mons: Normal without inguinal adenopathy  External genitalia: Normal  BUS/Urethra/Skene's glands: Normal  Bladder: Normal  Vagina: Normal  Uterus:  Surgically absent Adnexa/parametria:  Rt: Without masses or tenderness.  Lt: Without masses or tenderness.  Anus and perineum: Normal  Assessment & Plan:         Assessment & Plan:  EKG tracing is personally reviewed.  EKG notes NSR.  No acute changes.

## 2015-05-10 NOTE — Patient Instructions (Signed)
Keep working on [employer], exercise and weight loss. Good work so far! Complete lab work prior to leaving. Follow up in 4 months.

## 2015-05-10 NOTE — Assessment & Plan Note (Signed)
Obtain a1c, urine microalbumin,

## 2015-05-10 NOTE — Assessment & Plan Note (Signed)
Hx most consistent with seasonal asthma. Advised trial of advair. PRN albuterol.

## 2015-05-10 NOTE — Telephone Encounter (Signed)
Zolpidem Rx from today's visit printed and was faxed to [phone] ([facility]).

## 2015-05-12 ENCOUNTER — Telehealth: Payer: Self-pay | Admitting: Family

## 2015-05-12 NOTE — Telephone Encounter (Signed)
A1C was not run because I listed as future by accident. Could lab please add on?

## 2015-05-12 NOTE — Telephone Encounter (Signed)
Please let pt know that cholesterol is elevated. Is she taking crestor?  If she is taking crestor regularly, then I would recommend that we send her to lipid clinic.  If not taking, needs to start.   Liver, kidney, thyroid look good.

## 2015-05-13 ENCOUNTER — Other Ambulatory Visit (INDEPENDENT_AMBULATORY_CARE_PROVIDER_SITE_OTHER): Payer: PRIVATE HEALTH INSURANCE

## 2015-05-13 DIAGNOSIS — IMO0002 Reserved for concepts with insufficient information to code with codable children: Secondary | ICD-10-CM

## 2015-05-13 DIAGNOSIS — E1165 Type 2 diabetes mellitus with hyperglycemia: Secondary | ICD-10-CM | POA: Diagnosis not present

## 2015-05-13 LAB — HEMOGLOBIN A1C: HEMOGLOBIN A1C: 8.5 % — AB (ref 4.6–6.5)

## 2015-05-13 NOTE — Telephone Encounter (Signed)
A1c was added to previous labs, please advise.

## 2015-05-13 NOTE — Telephone Encounter (Signed)
Lab Results  Component Value Date   HGBA1C 8.5* 05/13/2015   Advise pt to increase lantus from 20 units to 24 units and keep upcoming apt with endo.

## 2015-05-14 NOTE — Telephone Encounter (Signed)
Left message for pt to return my call.

## 2015-05-14 NOTE — Telephone Encounter (Signed)
Pt returned call. Call her at [phone].

## 2015-05-14 NOTE — Telephone Encounter (Signed)
Notified pt of below recommendations.  Pt states she has not been taking Crestor regularly (may miss a couple days a week). Advised her to take it regularly and will recheck at next visit. Pt voices understanding.

## 2015-06-12 ENCOUNTER — Telehealth: Payer: Self-pay | Admitting: Family

## 2015-06-12 NOTE — Telephone Encounter (Signed)
Left detailed message for pt to call and schedule appt for evaluation as yeast and bacterial infections can have similar symptoms but are treated with different medications.

## 2015-06-12 NOTE — Telephone Encounter (Signed)
Caller name:Chariah Hollinshead  Relationship to patient: Self  Can be reached: [phone] (cell)  Pharmacy:  Reason for call: pt called in because she need a medication called in for yeast infection.

## 2015-06-27 ENCOUNTER — Encounter (HOSPITAL_BASED_OUTPATIENT_CLINIC_OR_DEPARTMENT_OTHER): Payer: Self-pay | Admitting: *Deleted

## 2015-06-27 DIAGNOSIS — Z8719 Personal history of other diseases of the digestive system: Secondary | ICD-10-CM | POA: Diagnosis not present

## 2015-06-27 DIAGNOSIS — Z7951 Long term (current) use of inhaled steroids: Secondary | ICD-10-CM | POA: Diagnosis not present

## 2015-06-27 DIAGNOSIS — I10 Essential (primary) hypertension: Secondary | ICD-10-CM | POA: Diagnosis not present

## 2015-06-27 DIAGNOSIS — E1165 Type 2 diabetes mellitus with hyperglycemia: Secondary | ICD-10-CM | POA: Diagnosis not present

## 2015-06-27 DIAGNOSIS — Z3202 Encounter for pregnancy test, result negative: Secondary | ICD-10-CM | POA: Diagnosis not present

## 2015-06-27 DIAGNOSIS — Z7982 Long term (current) use of aspirin: Secondary | ICD-10-CM | POA: Insufficient documentation

## 2015-06-27 DIAGNOSIS — Z79899 Other long term (current) drug therapy: Secondary | ICD-10-CM | POA: Insufficient documentation

## 2015-06-27 DIAGNOSIS — J302 Other seasonal allergic rhinitis: Secondary | ICD-10-CM | POA: Diagnosis not present

## 2015-06-27 DIAGNOSIS — Z8619 Personal history of other infectious and parasitic diseases: Secondary | ICD-10-CM | POA: Diagnosis not present

## 2015-06-27 LAB — CBG MONITORING, ED: Glucose-Capillary: 380 mg/dL — ABNORMAL HIGH (ref 65–99)

## 2015-06-27 NOTE — ED Notes (Signed)
Pt state that when she checked her BS this PM she had a reading of 500"something" denies N/V

## 2015-06-28 ENCOUNTER — Emergency Department (HOSPITAL_BASED_OUTPATIENT_CLINIC_OR_DEPARTMENT_OTHER): Payer: No Typology Code available for payment source

## 2015-06-28 ENCOUNTER — Emergency Department (HOSPITAL_BASED_OUTPATIENT_CLINIC_OR_DEPARTMENT_OTHER)
Admission: EM | Admit: 2015-06-28 | Discharge: 2015-06-28 | Disposition: A | Discharge status: Home / Self Care | Payer: No Typology Code available for payment source | Attending: Emergency Medicine | Admitting: Emergency Medicine

## 2015-06-28 ENCOUNTER — Encounter (HOSPITAL_BASED_OUTPATIENT_CLINIC_OR_DEPARTMENT_OTHER): Payer: Self-pay | Admitting: Emergency Medicine

## 2015-06-28 DIAGNOSIS — Z9109 Other allergy status, other than to drugs and biological substances: Secondary | ICD-10-CM

## 2015-06-28 DIAGNOSIS — R739 Hyperglycemia, unspecified: Secondary | ICD-10-CM

## 2015-06-28 LAB — URINALYSIS, ROUTINE W REFLEX MICROSCOPIC
Bilirubin Urine: NEGATIVE
Glucose, UA: >1000 mg/dL — AB
Hgb urine dipstick: NEGATIVE
Ketones, ur: NEGATIVE mg/dL
LEUKOCYTES UA: NEGATIVE
NITRITE: NEGATIVE
PROTEIN: NEGATIVE mg/dL
SPECIFIC GRAVITY, URINE: 1.028 (ref 1.005–1.030)
UROBILINOGEN UA: 0.2 mg/dL (ref 0.0–1.0)
pH: 6.5 (ref 5.0–8.0)

## 2015-06-28 LAB — PREGNANCY, URINE: PREG TEST UR: NEGATIVE

## 2015-06-28 LAB — CBG MONITORING, ED: Glucose-Capillary: 303 mg/dL — ABNORMAL HIGH (ref 65–99)

## 2015-06-28 LAB — URINE MICROSCOPIC-ADD ON

## 2015-06-28 MED ORDER — BENZONATATE 100 MG PO CAPS
200.0000 mg | ORAL_CAPSULE | Freq: Once | ORAL | Status: AC
  Administered 2015-06-28: 200 mg via ORAL
  Filled 2015-06-28: qty 2

## 2015-06-28 MED ORDER — BENZONATATE 100 MG PO CAPS: 100.0000 mg | ORAL_CAPSULE | Freq: Three times a day (TID) | ORAL | Status: DC

## 2015-06-28 MED ORDER — CETIRIZINE HCL 10 MG PO TABS: 10.0000 mg | ORAL_TABLET | Freq: Every day | ORAL | Status: AC

## 2015-06-28 MED ORDER — LORATADINE 10 MG PO TABS
10.0000 mg | ORAL_TABLET | Freq: Once | ORAL | Status: AC
  Administered 2015-06-28: 10 mg via ORAL
  Filled 2015-06-28: qty 1

## 2015-06-28 MED ORDER — INSULIN REGULAR HUMAN 100 UNIT/ML IJ SOLN
4.0000 [IU] | Freq: Once | INTRAMUSCULAR | Status: AC
  Administered 2015-06-28: 4 [IU] via SUBCUTANEOUS
  Filled 2015-06-28: qty 1

## 2015-06-28 NOTE — ED Notes (Signed)
C/o ha, cough, chest congestion, high bs, w freq urination

## 2015-06-28 NOTE — ED Provider Notes (Signed)
CSN: [id number]     Arrival date & time 06/27/15  2336 History   First MD Initiated Contact with Patient 06/28/15 0155     Chief Complaint  Patient presents with  . Hyperglycemia  . URI     (Consider location/radiation/quality/duration/timing/severity/associated sxs/prior Treatment) Patient is a 44 y.o. female presenting with hyperglycemia and URI. The history is provided by the patient.  Hyperglycemia Blood sugar level PTA:  500 Severity:  Moderate Timing:  Constant Progression:  Unchanged Chronicity:  Recurrent Diabetes status:  Controlled with insulin and controlled with oral medications Context: not change in medication and not insulin pump use   Relieved by:  Nothing Ineffective treatments:  None tried Associated symptoms: no chest pain, no dehydration, no diaphoresis, no fatigue, no fever, no increased thirst, no shortness of breath, no syncope and no vomiting   Associated symptoms comment:  Cough for 3 weeks told it was allergies started on Advair did not take tonight's lantus URI Presenting symptoms: cough   Presenting symptoms: no congestion, no fatigue and no fever   Associated symptoms: no wheezing     Past Medical History  Diagnosis Date  . Diabetes mellitus without complication   . Hypertension   . History of herpes genitalis   . HTN (hypertension)   . Cholelithiases 01/25/2014  . Fatty liver 01/25/2014   Past Surgical History  Procedure Laterality Date  . Partial hysterectomy     Family History  Problem Relation Age of Onset  . Hypertension Mother   . Diabetes Father   . Hypertension Sister   . Cancer Maternal Aunt     ovarian  . Cancer Maternal Uncle     lung  . Diabetes Maternal Grandmother   . Diabetes Maternal Grandfather   . Heart disease Neg Hx   . Diabetes Cousin    Social History  Substance Use Topics  . Smoking status: Never Smoker   . Smokeless tobacco: Never Used  . Alcohol Use: Yes     Comment: social   OB History    No data  available     Review of Systems  Constitutional: Negative for fever, diaphoresis and fatigue.  HENT: Negative for congestion and drooling.   Respiratory: Positive for cough. Negative for shortness of breath and wheezing.   Cardiovascular: Negative for chest pain, palpitations, leg swelling and syncope.  Gastrointestinal: Negative for vomiting.  Endocrine: Negative for polydipsia.  All other systems reviewed and are negative.     Allergies  Review of patient's allergies indicates no known allergies.  Home Medications   Prior to Admission medications   Medication Sig Start Date End Date Taking? Authorizing Provider  ACCU-CHEK FASTCLIX LANCETS MISC Check sugar twice daily. 09/10/14   Sandford Craze, NP  albuterol (PROVENTIL HFA;VENTOLIN HFA) 108 (90 BASE) MCG/ACT inhaler Inhale 2 puffs into the lungs every 6 (six) hours as needed for wheezing or shortness of breath. 05/10/15   Sandford Craze, NP  aspirin EC 81 MG tablet Take 81 mg by mouth daily.    Historical Provider, MD  escitalopram (LEXAPRO) 10 MG tablet Take one tab by mouth once daily 05/10/15   Sandford Craze, NP  ezetimibe (ZETIA) 10 MG tablet Take 1 tablet (10 mg total) by mouth daily. 05/10/15   Sandford Craze, NP  Fluticasone-Salmeterol (ADVAIR) 100-50 MCG/DOSE AEPB Inhale 1 puff into the lungs 2 (two) times daily. 05/10/15   Sandford Craze, NP  gabapentin (NEURONTIN) 300 MG capsule One caps by mouth every morning and 2 caps at bedtime.  05/10/15   Sandford Craze, NP  Glucose Blood (BLOOD GLUCOSE TEST STRIPS) STRP Use to check blood sugar twice a day. DX 250.00 09/10/14   Sandford Craze, NP  insulin aspart (NOVOLOG FLEXPEN) 100 UNIT/ML FlexPen Inject 6 units at breakfast, 6 units at lunch and 10 units at supper and per sliding scale 09/17/14   Carlus Pavlov, MD  Insulin Glargine (LANTUS SOLOSTAR) 100 UNIT/ML Solostar Pen Inject 20 units SQ QHS Patient taking differently: Inject 24 units SQ QHS 09/17/14    Carlus Pavlov, MD  Insulin Pen Needle 31G X 5 MM MISC Use 4x a day 09/10/14   Sandford Craze, NP  lisinopril (PRINIVIL,ZESTRIL) 10 MG tablet Take 1 tablet (10 mg total) by mouth daily. 05/10/15   Sandford Craze, NP  meloxicam (MOBIC) 7.5 MG tablet Take 1 tablet (7.5 mg total) by mouth daily. 08/25/13   Sandford Craze, NP  metFORMIN (GLUCOPHAGE-XR) 500 MG 24 hr tablet Take 2 tablets with breakfast and 2 with dinner. 09/17/14   Carlus Pavlov, MD  omeprazole (PRILOSEC) 40 MG capsule Take 1 capsule (40 mg total) by mouth daily. 09/10/14   Sandford Craze, NP  rosuvastatin (CRESTOR) 40 MG tablet Take 1 tablet (40 mg total) by mouth daily. 05/10/15   Sandford Craze, NP  valACYclovir (VALTREX) 1000 MG tablet Take 1 tablet (1,000 mg total) by mouth daily. 09/10/14   Sandford Craze, NP  zolpidem (AMBIEN) 5 MG tablet Take 1 tablet (5 mg total) by mouth at bedtime as needed. 05/10/15   Sandford Craze, NP   BP 122/79 mmHg  Pulse 84  Temp(Src) 98.3 F (36.8 C) (Oral)  Resp 18  Ht  (1.549 m)  Wt 160 lb (72.576 kg)  BMI 30.25 kg/m2  SpO2 98% Physical Exam  Constitutional: She is oriented to person, place, and time. She appears well-developed and well-nourished. No distress.  Sleeping in the room without coughing  HENT:  Head: Normocephalic and atraumatic.  Mouth/Throat: Oropharynx is clear and moist.  Eyes: Conjunctivae and EOM are normal. Pupils are equal, round, and reactive to light.  Neck: Normal range of motion. Neck supple.  Cardiovascular: Normal rate, regular rhythm and intact distal pulses.   Pulmonary/Chest: Effort normal and breath sounds normal. No respiratory distress. She has no wheezes. She has no rales. She exhibits no tenderness.  Abdominal: Soft. Bowel sounds are normal. There is no tenderness. There is no rebound and no guarding.  Musculoskeletal: Normal range of motion.  Neurological: She is alert and oriented to person, place, and time.  Skin:  Skin is warm and dry.  Psychiatric: She has a normal mood and affect.    ED Course  Procedures (including critical care time) Labs Review Labs Reviewed  URINALYSIS, ROUTINE W REFLEX MICROSCOPIC (NOT AT [facility]) - Abnormal; Notable for the following:    Glucose, UA >1000 (*)    All other components within normal limits  URINE MICROSCOPIC-ADD ON - Abnormal; Notable for the following:    Squamous Epithelial / LPF FEW (*)    All other components within normal limits  CBG MONITORING, ED - Abnormal; Notable for the following:    Glucose-Capillary 380 (*)    All other components within normal limits  PREGNANCY, URINE  CBG MONITORING, ED    Imaging Review Dg Chest 2 View  06/28/2015   CLINICAL DATA:  44 year old female with chest pain  EXAM: CHEST  2 VIEW  COMPARISON:  None.  FINDINGS: The heart size and mediastinal contours are within normal limits. Both lungs  are clear. The visualized skeletal structures are unremarkable.  IMPRESSION: No active cardiopulmonary disease.   Electronically Signed   By: Elgie Collard M.D.   On: 06/28/2015 03:20   I have personally reviewed and evaluated these images and lab results as part of my medical decision-making.   EKG Interpretation None      MDM   Final diagnoses:  Environmental allergies  Hyperglycemia   Results for orders placed or performed during the hospital encounter of 06/28/15  Urinalysis, Routine w reflex microscopic (not at [facility])  Result Value Ref Range   Color, Urine YELLOW YELLOW   APPearance CLEAR CLEAR   Specific Gravity, Urine 1.028 1.005 - 1.030   pH 6.5 5.0 - 8.0   Glucose, UA >1000 (A) NEGATIVE mg/dL   Hgb urine dipstick NEGATIVE NEGATIVE   Bilirubin Urine NEGATIVE NEGATIVE   Ketones, ur NEGATIVE NEGATIVE mg/dL   Protein, ur NEGATIVE NEGATIVE mg/dL   Urobilinogen, UA 0.2 0.0 - 1.0 mg/dL   Nitrite NEGATIVE NEGATIVE   Leukocytes, UA NEGATIVE NEGATIVE  Pregnancy, urine  Result Value Ref Range   Preg Test, Ur  NEGATIVE NEGATIVE  Urine microscopic-add on  Result Value Ref Range   Squamous Epithelial / LPF FEW (A) RARE   WBC, UA 0-2 <3 WBC/hpf   RBC / HPF 0-2 <3 RBC/hpf   Bacteria, UA RARE RARE  CBG monitoring, ED  Result Value Ref Range   Glucose-Capillary 380 (H) 65 - 99 mg/dL   Comment 1 Notify RN    Comment 2 Document in Chart    Dg Chest 2 View  06/28/2015   CLINICAL DATA:  44 year old female with chest pain  EXAM: CHEST  2 VIEW  COMPARISON:  None.  FINDINGS: The heart size and mediastinal contours are within normal limits. Both lungs are clear. The visualized skeletal structures are unremarkable.  IMPRESSION: No active cardiopulmonary disease.   Electronically Signed   By: Elgie Collard M.D.   On: 06/28/2015 03:20    Medications  loratadine (CLARITIN) tablet 10 mg (10 mg Oral Given 06/28/15 0235)  benzonatate (TESSALON) capsule 200 mg (200 mg Oral Given 06/28/15 0234)  insulin regular (NOVOLIN R,HUMULIN R) 100 units/mL injection 4 Units (4 Units Subcutaneous Given 06/28/15 0234)     Will start zyrtec daily and tessalon,  Stop cough syrup as this may be increasing your sugar.  Do not miss doses of your insulin as this will lead to increased sugar.  Follow up tomorrow with your PMD for ongoing care.  Strict return precautions given    Alec Mcphee, MD 06/28/15 1610

## 2015-06-28 NOTE — Discharge Instructions (Signed)
Allergies  Allergies may happen from anything your body is sensitive to. This may be food, medicines, pollens, chemicals, and many other things. Food allergies can be severe and deadly.  HOME CARE  If you do not know what causes a reaction, keep a diary. Write down the foods you ate and the symptoms that followed. Avoid foods that cause reactions.  If you have red raised spots (hives) or a rash:  Take medicine as told by your doctor.  Use medicines for red raised spots and itching as needed.  Apply cold cloths (compresses) to the skin. Take a cool bath. Avoid hot baths or showers.  If you are severely allergic:  It is often necessary to go to the hospital after you have treated your reaction.  Wear your medical alert jewelry.  You and your family must learn how to give a allergy shot or use an allergy kit (anaphylaxis kit).  Always carry your allergy kit or shot with you. Use this medicine as told by your doctor if a severe reaction is occurring. GET HELP RIGHT AWAY IF:  You have trouble breathing or are making high-pitched whistling sounds (wheezing).  You have a tight feeling in your chest or throat.  You have a puffy (swollen) mouth.  You have red raised spots, puffiness (swelling), or itching all over your body.  You have had a severe reaction that was helped by your allergy kit or shot. The reaction can return once the medicine has worn off.  You think you are having a food allergy. Symptoms most often happen within 30 minutes of eating a food.  Your symptoms have not gone away within 2 days or are getting worse.  You have new symptoms.  You want to retest yourself with a food or drink you think causes an allergic reaction. Only do this under the care of a doctor. MAKE SURE YOU:   Understand these instructions.  Will watch your condition.  Will get help right away if you are not doing well or get worse. Document Released: 01/23/2013 Document Reviewed:  01/23/2013 [facility] Patient Information 2015 [place]. This information is not intended to replace advice given to you by your health care provider. Make sure you discuss any questions you have with your health care provider.

## 2015-08-05 ENCOUNTER — Other Ambulatory Visit (HOSPITAL_COMMUNITY)
Admission: RE | Admit: 2015-08-05 | Discharge: 2015-08-05 | Disposition: A | Discharge status: Home / Self Care | Payer: PRIVATE HEALTH INSURANCE | Source: Ambulatory Visit | Attending: Family | Admitting: Family

## 2015-08-05 ENCOUNTER — Ambulatory Visit (INDEPENDENT_AMBULATORY_CARE_PROVIDER_SITE_OTHER): Payer: PRIVATE HEALTH INSURANCE | Admitting: Family

## 2015-08-05 ENCOUNTER — Encounter: Payer: Self-pay | Admitting: Family

## 2015-08-05 VITALS — BP 128/86 | HR 93 | Temp 98.2°F | Resp 16 | Ht 62.0 in | Wt 163.0 lb

## 2015-08-05 DIAGNOSIS — Z0000 Encounter for general adult medical examination without abnormal findings: Secondary | ICD-10-CM | POA: Diagnosis not present

## 2015-08-05 DIAGNOSIS — I10 Essential (primary) hypertension: Secondary | ICD-10-CM | POA: Diagnosis not present

## 2015-08-05 DIAGNOSIS — N760 Acute vaginitis: Secondary | ICD-10-CM | POA: Insufficient documentation

## 2015-08-05 DIAGNOSIS — J4520 Mild intermittent asthma, uncomplicated: Secondary | ICD-10-CM

## 2015-08-05 DIAGNOSIS — Z23 Encounter for immunization: Secondary | ICD-10-CM | POA: Diagnosis not present

## 2015-08-05 DIAGNOSIS — E785 Hyperlipidemia, unspecified: Secondary | ICD-10-CM

## 2015-08-05 DIAGNOSIS — N898 Other specified noninflammatory disorders of vagina: Secondary | ICD-10-CM

## 2015-08-05 LAB — BASIC METABOLIC PANEL
BUN: 12 mg/dL (ref 6–23)
CALCIUM: 9.6 mg/dL (ref 8.4–10.5)
CHLORIDE: 104 meq/L (ref 96–112)
CO2: 30 mEq/L (ref 19–32)
CREATININE: 0.65 mg/dL (ref 0.40–1.20)
GFR: 127.67 mL/min (ref >60.00)
Glucose, Bld: 123 mg/dL — ABNORMAL HIGH (ref 70–99)
Potassium: 4.1 mEq/L (ref 3.5–5.1)
SODIUM: 139 meq/L (ref 135–145)

## 2015-08-05 LAB — LDL CHOLESTEROL, DIRECT: Direct LDL: 129 mg/dL

## 2015-08-05 LAB — LIPID PANEL
CHOLESTEROL: 179 mg/dL (ref 0–200)
HDL: 30.2 mg/dL — ABNORMAL LOW (ref >39.00)
NonHDL: 149.18
TRIGLYCERIDES: 235 mg/dL — AB (ref 0.0–149.0)
Total CHOL/HDL Ratio: 6
VLDL: 47 mg/dL — ABNORMAL HIGH (ref 0.0–40.0)

## 2015-08-05 MED ORDER — INSULIN PEN NEEDLE 31G X 5 MM MISC: Status: DC

## 2015-08-05 NOTE — Progress Notes (Signed)
Subjective:    Patient ID: Breanna Avery, female    DOB: Aug 25, 1971, 44 y.o.   MRN: [id number]  HPI  [name] is a 44 yr old female who presents today for follow up.  1) DM2- currently maintained on victoza, metformin, lantus.  Has apt next week with endocrinology.  Lab Results  Component Value Date   HGBA1C 8.5* 05/13/2015   HGBA1C 9.8* 09/10/2014   HGBA1C 10.2* 10/23/2013   Lab Results  Component Value Date   MICROALBUR 1.0 05/10/2015   LDLCALC 183* 01/23/2014   CREATININE 0.69 05/10/2015    2) HTN- maintained on lisinopril.  Denies CP/SOB or swelling.   BP Readings from Last 3 Encounters:  08/05/15 128/86  06/28/15 122/79  05/10/15 120/70   3) Vaginal discharge-  Reports + white vaginal discharge with odor x 2 weeks.  Mild itching. Denies new sexual partners.   4) Asthma- maintained on advair.  Reports well controlled.  Continues zyrtec.    Review of Systems See HPI  Past Medical History  Diagnosis Date  . Diabetes mellitus without complication (HCC)   . Hypertension   . History of herpes genitalis   . HTN (hypertension)   . Cholelithiases 01/25/2014  . Fatty liver 01/25/2014    Social History   Social History  . Marital Status: Single    Spouse Name: N/A  . Number of Children: 3  . Years of Education: N/A   Occupational History  .     Social History Main Topics  . Smoking status: Never Smoker   . Smokeless tobacco: Never Used  . Alcohol Use: Yes     Comment: social  . Drug Use: Not on file  . Sexual Activity: Not on file   Other Topics Concern  . Not on file   Social History Narrative   Single   3 children- ages 35 (son), 46 (daughter) and 40 (son)   Younger two children at home   She is a med Best boy at [employer]    Completed 1 year of college   Enjoys movies, relaxing, bowling    Past Surgical History  Procedure Laterality Date  . Partial hysterectomy      Family History  Problem Relation Age of Onset  . Hypertension Mother     . Diabetes Father   . Hypertension Sister   . Cancer Maternal Aunt     ovarian  . Cancer Maternal Uncle     lung  . Diabetes Maternal Grandmother   . Diabetes Maternal Grandfather   . Heart disease Neg Hx   . Diabetes Cousin     No Known Allergies  Current Outpatient Prescriptions on File Prior to Visit  Medication Sig Dispense Refill  . ACCU-CHEK FASTCLIX LANCETS MISC Check sugar twice daily. 102 each 1  . albuterol (PROVENTIL HFA;VENTOLIN HFA) 108 (90 BASE) MCG/ACT inhaler Inhale 2 puffs into the lungs every 6 (six) hours as needed for wheezing or shortness of breath. 1 Inhaler 0  . aspirin EC 81 MG tablet Take 81 mg by mouth daily.    . benzonatate (TESSALON) 100 MG capsule Take 1 capsule (100 mg total) by mouth every 8 (eight) hours. 21 capsule 0  . cetirizine (ZYRTEC ALLERGY) 10 MG tablet Take 1 tablet (10 mg total) by mouth daily. 30 tablet 0  . escitalopram (LEXAPRO) 10 MG tablet Take one tab by mouth once daily 30 tablet 5  . ezetimibe (ZETIA) 10 MG tablet Take 1 tablet (10 mg total) by mouth  daily. 30 tablet 5  . Fluticasone-Salmeterol (ADVAIR) 100-50 MCG/DOSE AEPB Inhale 1 puff into the lungs 2 (two) times daily. 1 each 3  . gabapentin (NEURONTIN) 300 MG capsule One caps by mouth every morning and 2 caps at bedtime. 90 capsule 5  . Glucose Blood (BLOOD GLUCOSE TEST STRIPS) STRP Use to check blood sugar twice a day. DX 250.00 100 each 2  . Insulin Glargine (LANTUS SOLOSTAR) 100 UNIT/ML Solostar Pen Inject 20 units SQ QHS (Patient taking differently: Inject 24 units SQ QHS) 5 pen 2  . Insulin Pen Needle 31G X 5 MM MISC Use 4x a day 300 each prn  . lisinopril (PRINIVIL,ZESTRIL) 10 MG tablet Take 1 tablet (10 mg total) by mouth daily. 30 tablet 5  . meloxicam (MOBIC) 7.5 MG tablet Take 1 tablet (7.5 mg total) by mouth daily. 14 tablet 0  . metFORMIN (GLUCOPHAGE-XR) 500 MG 24 hr tablet Take 2 tablets with breakfast and 2 with dinner. 120 tablet 3  . omeprazole (PRILOSEC) 40 MG  capsule Take 1 capsule (40 mg total) by mouth daily. 30 capsule 3  . rosuvastatin (CRESTOR) 40 MG tablet Take 1 tablet (40 mg total) by mouth daily. 30 tablet 5  . valACYclovir (VALTREX) 1000 MG tablet Take 1 tablet (1,000 mg total) by mouth daily. 30 tablet 3  . zolpidem (AMBIEN) 5 MG tablet Take 1 tablet (5 mg total) by mouth at bedtime as needed. 30 tablet 0   No current facility-administered medications on file prior to visit.    BP 128/86 mmHg  Pulse 93  Temp(Src) 98.2 F (36.8 C) (Oral)  Resp 16  Ht 5\' 2"  (1.575 m)  Wt 163 lb (73.936 kg)  BMI 29.81 kg/m2  SpO2 100%       Objective:   Physical Exam  Constitutional: She is oriented to person, place, and time. She appears well-developed and well-nourished.  HENT:  Head: Normocephalic and atraumatic.  Cardiovascular: Normal rate, regular rhythm and normal heart sounds.   No murmur heard. Pulmonary/Chest: Effort normal and breath sounds normal. No respiratory distress. She has no wheezes.  Musculoskeletal: She exhibits no edema.  Neurological: She is alert and oriented to person, place, and time.  Psychiatric: She has a normal mood and affect. Her behavior is normal. Judgment and thought content normal.          Assessment & Plan:  Vaginal discharge- wet prep obtained- await results. Suspect yeast.

## 2015-08-05 NOTE — Patient Instructions (Signed)
Please complete lab work prior to leaving. Schedule mammogram on the first floor. We will contact you about your resutls.

## 2015-08-05 NOTE — Assessment & Plan Note (Addendum)
Stable on advair and prn albuterol. Continue same.

## 2015-08-05 NOTE — Assessment & Plan Note (Addendum)
BP stable, continue lisinopril.  Obtain bmet.

## 2015-08-05 NOTE — Assessment & Plan Note (Signed)
Management per Endo. 

## 2015-08-05 NOTE — Progress Notes (Signed)
Pre visit review using our clinic review tool, if applicable. No additional management support is needed unless otherwise documented below in the visit note. 

## 2015-08-06 ENCOUNTER — Telehealth: Payer: Self-pay | Admitting: Family

## 2015-08-06 ENCOUNTER — Telehealth: Payer: Self-pay | Admitting: *Deleted

## 2015-08-06 LAB — CERVICOVAGINAL ANCILLARY ONLY: Wet Prep (BD Affirm): POSITIVE — AB

## 2015-08-06 MED ORDER — METRONIDAZOLE 500 MG PO TABS: 500.0000 mg | ORAL_TABLET | Freq: Two times a day (BID) | ORAL | Status: DC

## 2015-08-06 NOTE — Telephone Encounter (Signed)
Left message for pt to return my call. Need to verify if pt has lived at [address] [place] previously as I am attempting to enter pt's immunization in the [employer] database and looks like she could already be in it under a previous address?.Marland Kitchen

## 2015-08-06 NOTE — Telephone Encounter (Signed)
[facility] database updated.

## 2015-08-06 NOTE — Telephone Encounter (Signed)
Pt states she was previously at [address]. Current address is [address]

## 2015-08-06 NOTE — Telephone Encounter (Signed)
Cholesterol above goal. Is she taking zetia and crestor?  Also, wet prep shows Bacterial vaginosis. Rx sent for metronidazole, avoid ETOH while one metronidazole.

## 2015-08-07 NOTE — Telephone Encounter (Signed)
Notified pt and she states she has not been taking zetia but will restart it. Voices understanding re: below treatment. Please advise if any other recommendations?

## 2015-08-12 ENCOUNTER — Ambulatory Visit (HOSPITAL_BASED_OUTPATIENT_CLINIC_OR_DEPARTMENT_OTHER): Payer: PRIVATE HEALTH INSURANCE

## 2015-08-12 ENCOUNTER — Ambulatory Visit (HOSPITAL_BASED_OUTPATIENT_CLINIC_OR_DEPARTMENT_OTHER)
Admission: RE | Admit: 2015-08-12 | Discharge: 2015-08-12 | Disposition: A | Discharge status: Home / Self Care | Payer: PRIVATE HEALTH INSURANCE | Source: Ambulatory Visit | Attending: Family | Admitting: Family

## 2015-08-12 DIAGNOSIS — Z1231 Encounter for screening mammogram for malignant neoplasm of breast: Secondary | ICD-10-CM | POA: Diagnosis not present

## 2015-08-12 DIAGNOSIS — Z0000 Encounter for general adult medical examination without abnormal findings: Secondary | ICD-10-CM

## 2015-08-14 ENCOUNTER — Telehealth: Payer: Self-pay | Admitting: Family

## 2015-08-14 MED ORDER — FLUCONAZOLE 150 MG PO TABS: ORAL_TABLET | ORAL | Status: DC

## 2015-08-14 NOTE — Telephone Encounter (Signed)
Rx sent for diflucan. If no improvement after diflucan advise office visit.

## 2015-08-14 NOTE — Telephone Encounter (Signed)
Caller name: Jamesetta Orleansricka   Relationship to patient: Self   Can be reached: [phone]  Reason for call: pt called in requesting a call back from CMA directly, She says that she would rather give details to her instead of discuss right now with me.

## 2015-08-14 NOTE — Telephone Encounter (Signed)
Left detailed message on pt's cell# and to call if any questions. 

## 2015-08-14 NOTE — Telephone Encounter (Signed)
Spoke with pt, she states she is now having vaginal itching and wonders if she now has a yeast infection.  Please advise what pt should do?

## 2015-11-29 ENCOUNTER — Other Ambulatory Visit: Payer: Self-pay | Admitting: Family

## 2016-01-21 ENCOUNTER — Emergency Department (HOSPITAL_BASED_OUTPATIENT_CLINIC_OR_DEPARTMENT_OTHER)
Admission: EM | Admit: 2016-01-21 | Discharge: 2016-01-21 | Disposition: A | Discharge status: Home / Self Care | Payer: PRIVATE HEALTH INSURANCE | Attending: Emergency Medicine | Admitting: Emergency Medicine

## 2016-01-21 ENCOUNTER — Encounter (HOSPITAL_BASED_OUTPATIENT_CLINIC_OR_DEPARTMENT_OTHER): Payer: Self-pay | Admitting: *Deleted

## 2016-01-21 DIAGNOSIS — R739 Hyperglycemia, unspecified: Secondary | ICD-10-CM

## 2016-01-21 DIAGNOSIS — Z79899 Other long term (current) drug therapy: Secondary | ICD-10-CM | POA: Diagnosis not present

## 2016-01-21 DIAGNOSIS — I10 Essential (primary) hypertension: Secondary | ICD-10-CM | POA: Diagnosis not present

## 2016-01-21 DIAGNOSIS — Z794 Long term (current) use of insulin: Secondary | ICD-10-CM | POA: Diagnosis not present

## 2016-01-21 DIAGNOSIS — E1165 Type 2 diabetes mellitus with hyperglycemia: Secondary | ICD-10-CM | POA: Insufficient documentation

## 2016-01-21 LAB — CBC WITH DIFFERENTIAL/PLATELET
Basophils Absolute: 0 10*3/uL (ref 0.0–0.1)
Basophils Relative: 0 %
EOS ABS: 0.1 10*3/uL (ref 0.0–0.7)
EOS PCT: 2 %
HCT: 40.8 % (ref 36.0–46.0)
Hemoglobin: 14.1 g/dL (ref 12.0–15.0)
Lymphocytes Relative: 51 %
Lymphs Abs: 4.5 10*3/uL — ABNORMAL HIGH (ref 0.7–4.0)
MCH: 30.9 pg (ref 26.0–34.0)
MCHC: 34.6 g/dL (ref 30.0–36.0)
MCV: 89.3 fL (ref 78.0–100.0)
MONO ABS: 0.4 10*3/uL (ref 0.1–1.0)
Monocytes Relative: 5 %
Neutro Abs: 3.6 10*3/uL (ref 1.7–7.7)
Neutrophils Relative %: 42 %
PLATELETS: 300 10*3/uL (ref 150–400)
RBC: 4.57 MIL/uL (ref 3.87–5.11)
RDW: 12.2 % (ref 11.5–15.5)
WBC: 8.7 10*3/uL (ref 4.0–10.5)

## 2016-01-21 LAB — URINALYSIS, ROUTINE W REFLEX MICROSCOPIC
BILIRUBIN URINE: NEGATIVE
Glucose, UA: >1000 mg/dL — AB
Hgb urine dipstick: NEGATIVE
Ketones, ur: NEGATIVE mg/dL
LEUKOCYTES UA: NEGATIVE
NITRITE: NEGATIVE
PH: 6 (ref 5.0–8.0)
Protein, ur: NEGATIVE mg/dL
SPECIFIC GRAVITY, URINE: 1.03 (ref 1.005–1.030)

## 2016-01-21 LAB — BASIC METABOLIC PANEL
Anion gap: 9 (ref 5–15)
BUN: 13 mg/dL (ref 6–20)
CALCIUM: 10.3 mg/dL (ref 8.9–10.3)
CO2: 26 mmol/L (ref 22–32)
CREATININE: 1.09 mg/dL — AB (ref 0.44–1.00)
Chloride: 97 mmol/L — ABNORMAL LOW (ref 101–111)
GFR calc Af Amer: >60 mL/min (ref >60)
Glucose, Bld: 501 mg/dL — ABNORMAL HIGH (ref 65–99)
Potassium: 4 mmol/L (ref 3.5–5.1)
SODIUM: 132 mmol/L — AB (ref 135–145)

## 2016-01-21 LAB — URINE MICROSCOPIC-ADD ON

## 2016-01-21 LAB — CBG MONITORING, ED
GLUCOSE-CAPILLARY: 476 mg/dL — AB (ref 65–99)
Glucose-Capillary: 549 mg/dL — ABNORMAL HIGH (ref 65–99)
Glucose-Capillary: 302 mg/dL — ABNORMAL HIGH (ref 65–99)

## 2016-01-21 MED ORDER — INSULIN ASPART 100 UNIT/ML ~~LOC~~ SOLN
10.0000 [IU] | Freq: Once | SUBCUTANEOUS | Status: AC
  Administered 2016-01-21: 10 [IU] via INTRAVENOUS
  Filled 2016-01-21: qty 1

## 2016-01-21 MED ORDER — SODIUM CHLORIDE 0.9 % IV BOLUS (SEPSIS)
1000.0000 mL | Freq: Once | INTRAVENOUS | Status: AC
  Administered 2016-01-21: 1000 mL via INTRAVENOUS

## 2016-01-21 NOTE — ED Notes (Signed)
Pt placed on auto vitals Q30.  

## 2016-01-21 NOTE — ED Provider Notes (Signed)
CSN: [id number]     Arrival date & time 01/21/16  1404 History   First MD Initiated Contact with Patient 01/21/16 1527     Chief Complaint  Patient presents with  . Hyperglycemia     (Consider location/radiation/quality/duration/timing/severity/associated sxs/prior Treatment) HPI Comments: Patient is a 45 year old female with history of diabetes, hypertension. She presents for evaluation of high blood sugar. She reports excessive thirst and urination over the past several days. She checked her blood sugar and it was "high". She tells me that she ran out of her insulin this morning and has no way to get this refilled until Thursday when she gets paid. She denies any fevers or chills. She denies any nausea, vomiting, or diarrhea.  Patient is a 45 y.o. female presenting with hyperglycemia. The history is provided by the patient.  Hyperglycemia Blood sugar level PTA:  "high" Severity:  Moderate Onset quality:  Sudden Duration:  2 days Timing:  Constant Progression:  Worsening Chronicity:  New Diabetes status:  Controlled with insulin and controlled with oral medications Context comment:  Ran out of insulin Relieved by:  Nothing Ineffective treatments:  None tried   Past Medical History  Diagnosis Date  . Diabetes mellitus without complication (HCC)   . Hypertension   . History of herpes genitalis   . HTN (hypertension)   . Cholelithiases 01/25/2014  . Fatty liver 01/25/2014   Past Surgical History  Procedure Laterality Date  . Partial hysterectomy     Family History  Problem Relation Age of Onset  . Hypertension Mother   . Diabetes Father   . Hypertension Sister   . Cancer Maternal Aunt     ovarian  . Cancer Maternal Uncle     lung  . Diabetes Maternal Grandmother   . Diabetes Maternal Grandfather   . Heart disease Neg Hx   . Diabetes Cousin    Social History  Substance Use Topics  . Smoking status: Never Smoker   . Smokeless tobacco: Never Used  . Alcohol Use: Yes      Comment: social   OB History    No data available     Review of Systems  All other systems reviewed and are negative.     Allergies  Review of patient's allergies indicates no known allergies.  Home Medications   Prior to Admission medications   Medication Sig Start Date End Date Taking? Authorizing Provider  ACCU-CHEK FASTCLIX LANCETS MISC Check sugar twice daily. 09/10/14   Sandford Craze, NP  albuterol (PROVENTIL HFA;VENTOLIN HFA) 108 (90 BASE) MCG/ACT inhaler Inhale 2 puffs into the lungs every 6 (six) hours as needed for wheezing or shortness of breath. 05/10/15   Sandford Craze, NP  aspirin EC 81 MG tablet Take 81 mg by mouth daily.    Historical Provider, MD  cetirizine (ZYRTEC ALLERGY) 10 MG tablet Take 1 tablet (10 mg total) by mouth daily. 06/28/15   April Palumbo, MD  escitalopram (LEXAPRO) 10 MG tablet Take one tab by mouth once daily 05/10/15   Sandford Craze, NP  ezetimibe (ZETIA) 10 MG tablet Take 1 tablet (10 mg total) by mouth daily. 05/10/15   Sandford Craze, NP  fluconazole (DIFLUCAN) 150 MG tablet One tab by mouth today, repeat in 3 days if symptoms not resolved 08/14/15   Sandford Craze, NP  Fluticasone-Salmeterol (ADVAIR) 100-50 MCG/DOSE AEPB Inhale 1 puff into the lungs 2 (two) times daily. 05/10/15   Sandford Craze, NP  gabapentin (NEURONTIN) 300 MG capsule One caps by mouth every  morning and 2 caps at bedtime. 05/10/15   Sandford Craze, NP  Glucose Blood (BLOOD GLUCOSE TEST STRIPS) STRP Use to check blood sugar twice a day. DX 250.00 09/10/14   Sandford Craze, NP  Insulin Glargine (LANTUS SOLOSTAR) 100 UNIT/ML Solostar Pen Inject 20 units SQ QHS Patient taking differently: Inject 24 units SQ QHS 09/17/14   Carlus Pavlov, MD  Insulin Pen Needle 31G X 5 MM MISC Use 4x a day 08/05/15   Sandford Craze, NP  Liraglutide (VICTOZA) 18 MG/3ML SOPN Inject 1.2 mg into the skin daily. 07/15/15   Historical Provider, MD  lisinopril  (PRINIVIL,ZESTRIL) 10 MG tablet Take 1 tablet (10 mg total) by mouth daily. 05/10/15   Sandford Craze, NP  meloxicam (MOBIC) 7.5 MG tablet Take 1 tablet (7.5 mg total) by mouth daily. 08/25/13   Sandford Craze, NP  metFORMIN (GLUCOPHAGE-XR) 500 MG 24 hr tablet Take 2 tablets with breakfast and 2 with dinner. 09/17/14   Carlus Pavlov, MD  metroNIDAZOLE (FLAGYL) 500 MG tablet Take 1 tablet (500 mg total) by mouth 2 (two) times daily. 08/06/15   Sandford Craze, NP  omeprazole (PRILOSEC) 40 MG capsule TAKE ONE CAPSULE BY MOUTH DAILY 11/29/15   Sandford Craze, NP  rosuvastatin (CRESTOR) 40 MG tablet Take 1 tablet (40 mg total) by mouth daily. 05/10/15   Sandford Craze, NP  valACYclovir (VALTREX) 1000 MG tablet Take 1 tablet (1,000 mg total) by mouth daily. 09/10/14   Sandford Craze, NP  zolpidem (AMBIEN) 5 MG tablet Take 1 tablet (5 mg total) by mouth at bedtime as needed. 05/10/15   Sandford Craze, NP   BP 157/100 mmHg  Pulse 87  Temp(Src) 98.3 F (36.8 C) (Oral)  Resp 20  Ht  (1.549 m)  Wt 158 lb (71.668 kg)  BMI 29.87 kg/m2  SpO2 100% Physical Exam  Constitutional: She is oriented to person, place, and time. She appears well-developed and well-nourished. No distress.  HENT:  Head: Normocephalic and atraumatic.  Neck: Normal range of motion. Neck supple.  Cardiovascular: Normal rate and regular rhythm.  Exam reveals no gallop and no friction rub.   No murmur heard. Pulmonary/Chest: Effort normal and breath sounds normal. No respiratory distress. She has no wheezes.  Abdominal: Soft. Bowel sounds are normal. She exhibits no distension. There is no tenderness.  Musculoskeletal: Normal range of motion.  Neurological: She is alert and oriented to person, place, and time.  Skin: Skin is warm and dry. She is not diaphoretic.  Nursing note and vitals reviewed.   ED Course  Procedures (including critical care time) Labs Review Labs Reviewed  URINALYSIS,  ROUTINE W REFLEX MICROSCOPIC (NOT AT [facility]) - Abnormal; Notable for the following:    Glucose, UA >1000 (*)    All other components within normal limits  URINE MICROSCOPIC-ADD ON - Abnormal; Notable for the following:    Squamous Epithelial / LPF 0-5 (*)    Bacteria, UA RARE (*)    All other components within normal limits  CBG MONITORING, ED - Abnormal; Notable for the following:    Glucose-Capillary 549 (*)    All other components within normal limits  BASIC METABOLIC PANEL  CBC WITH DIFFERENTIAL/PLATELET    Imaging Review No results found. I have personally reviewed and evaluated these images and lab results as part of my medical decision-making.   MDM   Final diagnoses:  None    Patient is 45 year old female who presents with complaints of elevated blood sugar. She is indeed hyperglycemic, however  there is no evidence for ketoacidosis. She was hydrated with normal saline and given intravenous insulin. Her sugar has now improved and I believe is appropriate for discharge. She does have prescriptions for her insulin awaiting her at the pharmacy. She tells me she will get these filled when she gets home.    Geoffery Lyonsouglas Aymen Widrig, MD 01/21/16 1755

## 2016-01-21 NOTE — ED Notes (Signed)
CBG is 476

## 2016-01-21 NOTE — Discharge Instructions (Signed)
Fill your prescriptions for insulin and take them as prescribed.  Keep a record of your blood sugars which you can take with you to your next doctor's appointment.   Hyperglycemia Hyperglycemia occurs when the glucose (sugar) in your blood is too high. Hyperglycemia can happen for many reasons, but it most often happens to people who do not know they have diabetes or are not managing their diabetes properly.  CAUSES  Whether you have diabetes or not, there are other causes of hyperglycemia. Hyperglycemia can occur when you have diabetes, but it can also occur in other situations that you might not be as aware of, such as: Diabetes  If you have diabetes and are having problems controlling your blood glucose, hyperglycemia could occur because of some of the following reasons:  Not following your meal plan.  Not taking your diabetes medications or not taking it properly.  Exercising less or doing less activity than you normally do.  Being sick. Pre-diabetes  This cannot be ignored. Before people develop Type 2 diabetes, they almost always have "pre-diabetes." This is when your blood glucose levels are higher than normal, but not yet high enough to be diagnosed as diabetes. Research has shown that some long-term damage to the body, especially the heart and circulatory system, may already be occurring during pre-diabetes. If you take action to manage your blood glucose when you have pre-diabetes, you may delay or prevent Type 2 diabetes from developing. Stress  If you have diabetes, you may be "diet" controlled or on oral medications or insulin to control your diabetes. However, you may find that your blood glucose is higher than usual in the hospital whether you have diabetes or not. This is often referred to as "stress hyperglycemia." Stress can elevate your blood glucose. This happens because of hormones put out by the body during times of stress. If stress has been the cause of your high  blood glucose, it can be followed regularly by your caregiver. That way he/she can make sure your hyperglycemia does not continue to get worse or progress to diabetes. Steroids  Steroids are medications that act on the infection fighting system (immune system) to block inflammation or infection. One side effect can be a rise in blood glucose. Most people can produce enough extra insulin to allow for this rise, but for those who cannot, steroids make blood glucose levels go even higher. It is not unusual for steroid treatments to "uncover" diabetes that is developing. It is not always possible to determine if the hyperglycemia will go away after the steroids are stopped. A special blood test called an A1c is sometimes done to determine if your blood glucose was elevated before the steroids were started. SYMPTOMS  Thirsty.  Frequent urination.  Dry mouth.  Blurred vision.  Tired or fatigue.  Weakness.  Sleepy.  Tingling in feet or leg. DIAGNOSIS  Diagnosis is made by monitoring blood glucose in one or all of the following ways:  A1c test. This is a chemical found in your blood.  Fingerstick blood glucose monitoring.  Laboratory results. TREATMENT  First, knowing the cause of the hyperglycemia is important before the hyperglycemia can be treated. Treatment may include, but is not be limited to:  Education.  Change or adjustment in medications.  Change or adjustment in meal plan.  Treatment for an illness, infection, etc.  More frequent blood glucose monitoring.  Change in exercise plan.  Decreasing or stopping steroids.  Lifestyle changes. HOME CARE INSTRUCTIONS   Test  your blood glucose as directed.  Exercise regularly. Your caregiver will give you instructions about exercise. Pre-diabetes or diabetes which comes on with stress is helped by exercising.  Eat wholesome, balanced meals. Eat often and at regular, fixed times. Your caregiver or nutritionist will give you  a meal plan to guide your sugar intake.  Being at an ideal weight is important. If needed, losing as little as 10 to 15 pounds may help improve blood glucose levels. SEEK MEDICAL CARE IF:   You have questions about medicine, activity, or diet.  You continue to have symptoms (problems such as increased thirst, urination, or weight gain). SEEK IMMEDIATE MEDICAL CARE IF:   You are vomiting or have diarrhea.  Your breath smells fruity.  You are breathing faster or slower.  You are very sleepy or incoherent.  You have numbness, tingling, or pain in your feet or hands.  You have chest pain.  Your symptoms get worse even though you have been following your caregiver's orders.  If you have any other questions or concerns.   This information is not intended to replace advice given to you by your health care provider. Make sure you discuss any questions you have with your health care provider.   Document Released: 03/24/2001 Document Revised: 12/21/2011 Document Reviewed: 06/04/2015 Elsevier Interactive Patient Education [employer]

## 2016-01-21 NOTE — ED Notes (Signed)
Patient A&Ox4 and in NAD at discharge. Patient ambulatory and by self.

## 2016-01-21 NOTE — ED Notes (Signed)
Patient A&Ox4 and in NAD. Patient ambulated to the restroom without assistance. Patient reports "not feeling right". Patient has IV fluids running and BS recheck in one hour.

## 2016-01-21 NOTE — ED Notes (Signed)
cbg is reading high. States she is thirsty and having urinary frequency.

## 2016-01-23 ENCOUNTER — Ambulatory Visit (INDEPENDENT_AMBULATORY_CARE_PROVIDER_SITE_OTHER): Payer: PRIVATE HEALTH INSURANCE | Admitting: Family Medicine

## 2016-01-23 ENCOUNTER — Encounter: Payer: Self-pay | Admitting: Family Medicine

## 2016-01-23 ENCOUNTER — Other Ambulatory Visit (HOSPITAL_COMMUNITY)
Admission: RE | Admit: 2016-01-23 | Discharge: 2016-01-23 | Disposition: A | Discharge status: Home / Self Care | Payer: PRIVATE HEALTH INSURANCE | Source: Ambulatory Visit | Attending: Family Medicine | Admitting: Family Medicine

## 2016-01-23 VITALS — BP 130/64 | HR 98 | Temp 97.9°F | Ht 61.0 in | Wt 159.6 lb

## 2016-01-23 DIAGNOSIS — N898 Other specified noninflammatory disorders of vagina: Secondary | ICD-10-CM

## 2016-01-23 DIAGNOSIS — N760 Acute vaginitis: Secondary | ICD-10-CM | POA: Insufficient documentation

## 2016-01-23 MED ORDER — FLUCONAZOLE 150 MG PO TABS: ORAL_TABLET | ORAL | Status: DC

## 2016-01-23 NOTE — Patient Instructions (Signed)
We are going to treat you for a vaginal yeast infection with diflucan- take one pill and then repeat in 3-7 days if symptoms persist.  I will be in touch with your swab results asap Let me know if you have any other concerns in the meantime

## 2016-01-23 NOTE — Progress Notes (Signed)
Pre visit review using our clinic review tool, if applicable. No additional management support is needed unless otherwise documented below in the visit note. 

## 2016-01-23 NOTE — Progress Notes (Signed)
[facility] at [facility] [address] [place], [place] [address] [phone]  Date:  01/23/2016   Name:  Breanna Avery   DOB:  08-06-1971   MRN:  [id number]  PCP:  Lemont Fillers., NP    Chief Complaint: Vaginal Discharge   History of Present Illness:  Breanna Avery is a 45 y.o. very pleasant female patient who presents with the following:  Here today with concern of vaginal itching and irriation for about 2 days. She also has some thick white discharge.  Seems likea yeast infection to her.   No fever, no abd pain, no urinary sx  History of DM- she sees an endocrinologist now.  They still have a hard time getting her glucose under control She has a history of hysterectomy  Lab Results  Component Value Date   HGBA1C 8.5* 05/13/2015     Patient Active Problem List   Diagnosis Date Noted  . Asthma 05/10/2015  . Pain of upper abdomen 03/08/2015  . Acute bronchitis 09/10/2014  . Flank pain 09/10/2014  . Fatty liver 01/25/2014  . Cholelithiases 01/25/2014  . Tinea pedis 01/23/2014  . Atypical chest pain 12/05/2013  . Irritability 10/23/2013  . Low back pain radiating to right leg 08/25/2013  . Pain in joint, ankle and foot 08/25/2013  . Skin tag 05/01/2013  . Routine general medical examination at a health care facility 04/11/2013  . Epigastric abdominal tenderness 04/11/2013  . Diabetes type 2, uncontrolled (HCC) 02/28/2013  . Genital herpes 02/28/2013  . Hyperlipidemia 02/28/2013  . Peripheral neuropathy (HCC) 02/28/2013  . HTN (hypertension) 02/28/2013    Past Medical History  Diagnosis Date  . Diabetes mellitus without complication (HCC)   . Hypertension   . History of herpes genitalis   . HTN (hypertension)   . Cholelithiases 01/25/2014  . Fatty liver 01/25/2014    Past Surgical History  Procedure Laterality Date  . Partial hysterectomy      Social History  Substance Use Topics  . Smoking  status: Never Smoker   . Smokeless tobacco: Never Used  . Alcohol Use: Yes     Comment: social    Family History  Problem Relation Age of Onset  . Hypertension Mother   . Diabetes Father   . Hypertension Sister   . Cancer Maternal Aunt     ovarian  . Cancer Maternal Uncle     lung  . Diabetes Maternal Grandmother   . Diabetes Maternal Grandfather   . Heart disease Neg Hx   . Diabetes Cousin     No Known Allergies  Medication list has been reviewed and updated.  Current Outpatient Prescriptions on File Prior to Visit  Medication Sig Dispense Refill  . ACCU-CHEK FASTCLIX LANCETS MISC Check sugar twice daily. 102 each 1  . albuterol (PROVENTIL HFA;VENTOLIN HFA) 108 (90 BASE) MCG/ACT inhaler Inhale 2 puffs into the lungs every 6 (six) hours as needed for wheezing or shortness of breath. 1 Inhaler 0  . aspirin EC 81 MG tablet Take 81 mg by mouth daily.    . cetirizine (ZYRTEC ALLERGY) 10 MG tablet Take 1 tablet (10 mg total) by mouth daily. 30 tablet 0  . escitalopram (LEXAPRO) 10 MG tablet Take one tab by mouth once daily 30 tablet 5  . ezetimibe (ZETIA) 10 MG tablet Take 1 tablet (10 mg total) by mouth daily. 30 tablet 5  . fluconazole (DIFLUCAN) 150 MG tablet One tab by mouth today,  repeat in 3 days if symptoms not resolved 2 tablet 0  . Fluticasone-Salmeterol (ADVAIR) 100-50 MCG/DOSE AEPB Inhale 1 puff into the lungs 2 (two) times daily. 1 each 3  . gabapentin (NEURONTIN) 300 MG capsule One caps by mouth every morning and 2 caps at bedtime. 90 capsule 5  . Glucose Blood (BLOOD GLUCOSE TEST STRIPS) STRP Use to check blood sugar twice a day. DX 250.00 100 each 2  . Insulin Glargine (LANTUS SOLOSTAR) 100 UNIT/ML Solostar Pen Inject 20 units SQ QHS (Patient taking differently: Inject 24 units SQ QHS) 5 pen 2  . Insulin Pen Needle 31G X 5 MM MISC Use 4x a day 300 each 1  . Liraglutide (VICTOZA) 18 MG/3ML SOPN Inject 1.2 mg into the skin daily.    Marland Kitchen. lisinopril (PRINIVIL,ZESTRIL) 10  MG tablet Take 1 tablet (10 mg total) by mouth daily. 30 tablet 5  . meloxicam (MOBIC) 7.5 MG tablet Take 1 tablet (7.5 mg total) by mouth daily. 14 tablet 0  . metFORMIN (GLUCOPHAGE-XR) 500 MG 24 hr tablet Take 2 tablets with breakfast and 2 with dinner. 120 tablet 3  . metroNIDAZOLE (FLAGYL) 500 MG tablet Take 1 tablet (500 mg total) by mouth 2 (two) times daily. 14 tablet 0  . omeprazole (PRILOSEC) 40 MG capsule TAKE ONE CAPSULE BY MOUTH DAILY 30 capsule 5  . rosuvastatin (CRESTOR) 40 MG tablet Take 1 tablet (40 mg total) by mouth daily. 30 tablet 5  . valACYclovir (VALTREX) 1000 MG tablet Take 1 tablet (1,000 mg total) by mouth daily. 30 tablet 3  . zolpidem (AMBIEN) 5 MG tablet Take 1 tablet (5 mg total) by mouth at bedtime as needed. 30 tablet 0   No current facility-administered medications on file prior to visit.    Review of Systems:  As per HPI- otherwise negative.   Physical Examination: Filed Vitals:   01/23/16 1541  BP: 130/64  Pulse: 98  Temp: 97.9 F (36.6 C)   Filed Vitals:   01/23/16 1541  Height: 5\' 1"  (1.549 m)  Weight: 159 lb 9.6 oz (72.394 kg)   Body mass index is 30.17 kg/(m^2). Ideal Body Weight: Weight in (lb) to have BMI = 25: 132  GEN: WDWN, NAD, Non-toxic, A & O x 3, overweight, looks well HEENT: Atraumatic, Normocephalic. Neck supple. No masses, No LAD. Ears and Nose: No external deformity. CV: RRR, No M/G/R. No JVD. No thrill. No extra heart sounds. PULM: CTA B, no wheezes, crackles, rhonchi. No retractions. No resp. distress. No accessory muscle use. ABD: S, NT, ND, +BS. No rebound. No HSM. EXTR: No c/c/e NEURO Normal gait.  PSYCH: Normally interactive. Conversant. Not depressed or anxious appearing.  Calm demeanor.  Pelvic: normal except for mild vaginal inflammation, no vaginal lesions or discharge. No adnexal tendereness or masses  Wet prep pending  Assessment and Plan: Vaginal irritation - Plan: Cervicovaginal ancillary only,  fluconazole (DIFLUCAN) 150 MG tablet  Vaginal discharge - Plan: Cervicovaginal ancillary only  Treat for a presumed vaginal yeast infection with diflucan. She will let me know if not feeling better- Sooner if worse.   Meds ordered this encounter  Medications  . fluconazole (DIFLUCAN) 150 MG tablet    Sig: One tab by mouth today, repeat in 3 days if symptoms not resolved    Dispense:  2 tablet    Refill:  0     Signed Abbe AmsterdamJessica Bevan Disney, MD

## 2016-01-28 ENCOUNTER — Telehealth: Payer: Self-pay | Admitting: Family

## 2016-01-28 DIAGNOSIS — N898 Other specified noninflammatory disorders of vagina: Secondary | ICD-10-CM

## 2016-01-28 LAB — CERVICOVAGINAL ANCILLARY ONLY: Wet Prep (BD Affirm): POSITIVE — AB

## 2016-01-28 NOTE — Telephone Encounter (Signed)
Per verbal from PCP, ok to send Metronidazole 500mg  1 tablet twice a day for 7 days, #14 x no refills. Avoid alcohol while taking medication and return to the lab for urine GC/chlamydia (needs to schedule lab appt).  Left message for pt to return my call.

## 2016-01-28 NOTE — Telephone Encounter (Signed)
Breanna Avery-- pt saw Dr Patsy Lageropland on 01/23/16.  Please review and advise?

## 2016-01-28 NOTE — Telephone Encounter (Signed)
[facility] and she states that BD affirm was positive for BV and result should be available in EPIC. She states she will check and see why we cannot see result yet. If yeast is negative, result will not show in EPIC from BD affirm testing at this time.  Please advise?

## 2016-01-28 NOTE — Telephone Encounter (Signed)
Swab for chlamydia and gonorrhea wasn't received could only affirm swab.    Malachi BondsGloria called from [facility] [phone]

## 2016-01-28 NOTE — Telephone Encounter (Signed)
Lets have her return to lab for GC/Chlamydia urine probe please.

## 2016-01-29 ENCOUNTER — Other Ambulatory Visit: Payer: PRIVATE HEALTH INSURANCE

## 2016-01-29 ENCOUNTER — Encounter: Payer: Self-pay | Admitting: Family Medicine

## 2016-01-29 MED ORDER — METRONIDAZOLE 500 MG PO TABS: 500.0000 mg | ORAL_TABLET | Freq: Two times a day (BID) | ORAL | Status: DC

## 2016-01-29 NOTE — Telephone Encounter (Signed)
Notified pt and she voices understanding. She will return to the lab today for urine gc/chlamydia.

## 2016-02-03 ENCOUNTER — Ambulatory Visit: Payer: PRIVATE HEALTH INSURANCE | Admitting: Family

## 2016-02-04 ENCOUNTER — Other Ambulatory Visit: Payer: Self-pay | Admitting: Family

## 2016-02-04 ENCOUNTER — Other Ambulatory Visit (INDEPENDENT_AMBULATORY_CARE_PROVIDER_SITE_OTHER): Payer: PRIVATE HEALTH INSURANCE

## 2016-02-04 DIAGNOSIS — N898 Other specified noninflammatory disorders of vagina: Secondary | ICD-10-CM

## 2016-02-05 ENCOUNTER — Encounter: Payer: PRIVATE HEALTH INSURANCE | Admitting: Medical

## 2016-02-05 ENCOUNTER — Telehealth: Payer: Self-pay | Admitting: Medical

## 2016-02-05 DIAGNOSIS — Z0289 Encounter for other administrative examinations: Secondary | ICD-10-CM

## 2016-02-05 LAB — GC/CHLAMYDIA PROBE AMP
CT Probe RNA: NOT DETECTED
GC Probe RNA: NOT DETECTED

## 2016-02-05 NOTE — Progress Notes (Signed)
This encounter was created in error - please disregard.

## 2016-02-07 ENCOUNTER — Telehealth: Payer: Self-pay | Admitting: *Deleted

## 2016-02-07 MED ORDER — VALACYCLOVIR HCL 1 G PO TABS: 1000.0000 mg | ORAL_TABLET | Freq: Every day | ORAL | Status: AC

## 2016-02-07 NOTE — Telephone Encounter (Signed)
[facility]. Refills sent. Pt has f/u with PCP 02/17/16.

## 2016-02-10 NOTE — Telephone Encounter (Signed)
Pt was no show 02/05/16 3:30pm for acute appt, 2nd no show w/in 12 months, scheduled for routine f/u with Melissa 02/17/16, charge or no charge?

## 2016-02-10 NOTE — Telephone Encounter (Signed)
charge 

## 2016-02-11 ENCOUNTER — Encounter: Payer: Self-pay | Admitting: Medical

## 2016-02-11 NOTE — Telephone Encounter (Signed)
Marked to charge and mailing no show letter °

## 2016-02-17 ENCOUNTER — Telehealth: Payer: Self-pay | Admitting: Family

## 2016-02-17 ENCOUNTER — Ambulatory Visit: Payer: PRIVATE HEALTH INSURANCE | Admitting: Family

## 2016-02-17 DIAGNOSIS — Z0289 Encounter for other administrative examinations: Secondary | ICD-10-CM

## 2016-02-21 NOTE — Telephone Encounter (Signed)
Pt was no show 02/17/16 for follow up appt, pt scheduled for 02/24/16, 2nd no show, charge or no charge?

## 2016-02-23 NOTE — Telephone Encounter (Signed)
Yes please

## 2016-02-24 ENCOUNTER — Telehealth: Payer: Self-pay | Admitting: Family

## 2016-02-24 ENCOUNTER — Ambulatory Visit: Payer: PRIVATE HEALTH INSURANCE | Admitting: Family

## 2016-02-24 DIAGNOSIS — Z0289 Encounter for other administrative examinations: Secondary | ICD-10-CM

## 2016-02-25 ENCOUNTER — Encounter: Payer: Self-pay | Admitting: Family

## 2016-02-25 NOTE — Telephone Encounter (Signed)
Marked to charge and mailing no show letter °

## 2016-03-03 ENCOUNTER — Encounter: Payer: Self-pay | Admitting: Family

## 2016-03-03 NOTE — Telephone Encounter (Signed)
Pt was no show 02/24/16 for follow up appt, 3rd no show w/in 1 month, charge or no charge?

## 2016-03-04 ENCOUNTER — Telehealth: Payer: Self-pay | Admitting: Family

## 2016-03-04 NOTE — Telephone Encounter (Signed)
Patient dismissed from [facility] by Sandford CrazeMelissa O'Sullivan, effective Mar 03, 2016. Dismissal letter sent out by certified / registered mail. DAJ

## 2016-03-06 ENCOUNTER — Encounter: Payer: Self-pay | Admitting: Family

## 2016-03-06 ENCOUNTER — Ambulatory Visit (INDEPENDENT_AMBULATORY_CARE_PROVIDER_SITE_OTHER): Payer: PRIVATE HEALTH INSURANCE | Admitting: Family

## 2016-03-06 ENCOUNTER — Other Ambulatory Visit (HOSPITAL_COMMUNITY)
Admission: RE | Admit: 2016-03-06 | Discharge: 2016-03-06 | Disposition: A | Discharge status: Home / Self Care | Payer: PRIVATE HEALTH INSURANCE | Source: Ambulatory Visit | Attending: Family | Admitting: Family

## 2016-03-06 VITALS — BP 115/88 | HR 101 | Temp 98.2°F | Resp 20 | Ht 62.0 in | Wt 164.0 lb

## 2016-03-06 DIAGNOSIS — R059 Cough, unspecified: Secondary | ICD-10-CM

## 2016-03-06 DIAGNOSIS — R102 Pelvic and perineal pain unspecified side: Secondary | ICD-10-CM

## 2016-03-06 DIAGNOSIS — N760 Acute vaginitis: Secondary | ICD-10-CM | POA: Diagnosis present

## 2016-03-06 DIAGNOSIS — R05 Cough: Secondary | ICD-10-CM

## 2016-03-06 MED ORDER — FLUTICASONE PROPIONATE 50 MCG/ACT NA SUSP: 2.0000 | Freq: Every day | NASAL | Status: AC

## 2016-03-06 MED ORDER — ROSUVASTATIN CALCIUM 40 MG PO TABS: 40.0000 mg | ORAL_TABLET | Freq: Every day | ORAL | Status: AC

## 2016-03-06 NOTE — Telephone Encounter (Signed)
Yes please

## 2016-03-06 NOTE — Progress Notes (Signed)
Subjective:    Patient ID: Breanna Avery, female    DOB: 1971/07/31, 45 y.o.   MRN: [id number]  HPI  Breanna Avery is a 45 yr old female who presents today with two concerns:  1) Vaginal discharge- Pt completed metronidazole and still has some pelvic pain/vaginal discharge. Denies itching, mild vaginal discharge and some lower abdominal cramping. Denies dysuria/frequent. No new sexual partners.    2) Cough- pt reports 2 month hx of cough. Cough has been occuring mostly at night. Has been using otc medications. Symptoms are worse at night.  Occasional wheezing which improved with the albuterol but cough does not improve.  She continues omeprazole, denies gerd symptoms. Does have + seasonal allergies and post-nasal drip despite use of daily zyrtec.  Review of Systems See HPI  Past Medical History  Diagnosis Date  . Diabetes mellitus without complication (HCC)   . Hypertension   . History of herpes genitalis   . HTN (hypertension)   . Cholelithiases 01/25/2014  . Fatty liver 01/25/2014     Social History   Social History  . Marital Status: Single    Spouse Name: N/A  . Number of Children: 3  . Years of Education: N/A   Occupational History  .     Social History Main Topics  . Smoking status: Never Smoker   . Smokeless tobacco: Never Used  . Alcohol Use: Yes     Comment: social  . Drug Use: Not on file  . Sexual Activity: Not on file   Other Topics Concern  . 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 Partial hysterectomy      Family History  Problem Relation Age of Onset  . Hypertension Mother   . Diabetes Father   . Hypertension Sister   . Cancer Maternal Aunt     ovarian  . Cancer Maternal Uncle     lung  .  Diabetes Maternal Grandmother   . Diabetes Maternal Grandfather   . Heart disease Neg Hx   . Diabetes Cousin     No Known Allergies  Current Outpatient Prescriptions on File Prior to Visit  Medication Sig Dispense Refill  . ACCU-CHEK FASTCLIX LANCETS MISC Check sugar twice daily. 102 each 1  . albuterol (PROVENTIL HFA;VENTOLIN HFA) 108 (90 BASE) MCG/ACT inhaler Inhale 2 puffs into the lungs every 6 (six) hours as needed for wheezing or shortness of breath. 1 Inhaler 0  . aspirin EC 81 MG tablet Take 81 mg by mouth daily.    . cetirizine (ZYRTEC ALLERGY) 10 MG tablet Take 1 tablet (10 mg total) by mouth daily. 30 tablet 0  . escitalopram (LEXAPRO) 10 MG tablet Take one tab by mouth once daily 30 tablet 5  . ezetimibe (ZETIA) 10 MG tablet Take 1 tablet (10 mg total) by mouth daily. 30 tablet 5  . Fluticasone-Salmeterol (ADVAIR) 100-50 MCG/DOSE AEPB Inhale 1 puff into the lungs 2 (two) times daily. 1 each 3  . gabapentin (NEURONTIN) 300 MG capsule One caps by mouth every morning and 2 caps at bedtime. 90 capsule 5  . Glucose Blood (BLOOD GLUCOSE TEST STRIPS) STRP Use to check blood sugar twice  a day. DX 250.00 100 each 2  . Insulin Glargine (LANTUS SOLOSTAR) 100 UNIT/ML Solostar Pen Inject 20 units SQ QHS (Patient taking differently: Inject 24 units SQ QHS) 5 pen 2  . Insulin Pen Needle 31G X 5 MM MISC Use 4x a day 300 each 1  . Liraglutide (VICTOZA) 18 MG/3ML SOPN Inject 1.2 mg into the skin daily.    Marland Kitchen lisinopril (PRINIVIL,ZESTRIL) 10 MG tablet Take 1 tablet (10 mg total) by mouth daily. 30 tablet 5  . meloxicam (MOBIC) 7.5 MG tablet Take 1 tablet (7.5 mg total) by mouth daily. 14 tablet 0  . metFORMIN (GLUCOPHAGE-XR) 500 MG 24 hr tablet Take 2 tablets with breakfast and 2 with dinner. 120 tablet 3  . omeprazole (PRILOSEC) 40 MG capsule TAKE ONE CAPSULE BY MOUTH DAILY 30 capsule 5  . rosuvastatin (CRESTOR) 40 MG tablet Take 1 tablet (40 mg total) by mouth daily. 30 tablet 5  . valACYclovir  (VALTREX) 1000 MG tablet Take 1 tablet (1,000 mg total) by mouth daily. 30 tablet 5  . zolpidem (AMBIEN) 5 MG tablet Take 1 tablet (5 mg total) by mouth at bedtime as needed. 30 tablet 0   No current facility-administered medications on file prior to visit.    BP 115/88 mmHg  Pulse 101  Temp(Src) 98.2 F (36.8 C) (Oral)  Resp 20  Ht  (1.575 m)  Wt 164 lb (74.39 kg)  BMI 29.99 kg/m2  SpO2 99%       Objective:   Physical Exam  Constitutional: She is oriented to person, place, and time. She appears well-developed and well-nourished.  Cardiovascular: Normal rate, regular rhythm and normal heart sounds.   No murmur heard. Pulmonary/Chest: Effort normal and breath sounds normal. No respiratory distress. She has no wheezes.  Genitourinary: Vagina normal.  Musculoskeletal: She exhibits no edema.  Neurological: She is alert and oriented to person, place, and time.  Psychiatric: She has a normal mood and affect. Her behavior is normal. Judgment and thought content normal.          Assessment & Plan:  Cough- suspect secondary to allergic rhinitis. Advised trial of flonase, continue zytec, prn albuterol.   Pelvic pain/vaginal discharge- repeat wet prep, obtain urine culture to rule out UTI.

## 2016-03-06 NOTE — Telephone Encounter (Signed)
Charge or no charge? °

## 2016-03-06 NOTE — Progress Notes (Signed)
Pre visit review using our clinic review tool, if applicable. No additional management support is needed unless otherwise documented below in the visit note. 

## 2016-03-06 NOTE — Telephone Encounter (Signed)
Marked to charge and mailing no show letter °

## 2016-03-06 NOTE — Patient Instructions (Signed)
Complete lab work prior to leaving.  Please start flonase 2 sprays each nostril once daily. We will contact you with the results of your wet prep.

## 2016-03-06 NOTE — Addendum Note (Signed)
Addended by: Mervin KungFERGERSON, Chastity Noland A on: 03/06/2016 05:36 PM   Modules accepted: Orders

## 2016-03-08 LAB — URINE CULTURE: Colony Count: 75000

## 2016-03-10 ENCOUNTER — Telehealth: Payer: Self-pay | Admitting: Family

## 2016-03-10 MED ORDER — NITROFURANTOIN MACROCRYSTAL 100 MG PO CAPS: 100.0000 mg | ORAL_CAPSULE | Freq: Two times a day (BID) | ORAL | Status: DC

## 2016-03-10 NOTE — Telephone Encounter (Signed)
Notified pt and she voices understanding. 

## 2016-03-10 NOTE — Telephone Encounter (Signed)
Vaginal ancillary testing is still pending, however,  Urine culture shows possible UTI. Rx sent for macrodantin for her to begin.

## 2016-03-11 LAB — CERVICOVAGINAL ANCILLARY ONLY: WET PREP (BD AFFIRM): NEGATIVE

## 2016-03-12 ENCOUNTER — Telehealth: Payer: Self-pay | Admitting: Family

## 2016-03-12 ENCOUNTER — Encounter: Payer: Self-pay | Admitting: Family

## 2016-03-12 NOTE — Telephone Encounter (Signed)
Spoke with pt and she voices understanding.  

## 2016-03-12 NOTE — Telephone Encounter (Signed)
Please let pt know that I reviewed her wet prep- negative for yeast, bacteria, trich.

## 2016-04-30 ENCOUNTER — Encounter: Payer: Self-pay | Admitting: Family

## 2016-04-30 ENCOUNTER — Ambulatory Visit (INDEPENDENT_AMBULATORY_CARE_PROVIDER_SITE_OTHER): Payer: PRIVATE HEALTH INSURANCE | Admitting: Family

## 2016-04-30 VITALS — BP 130/80 | HR 86 | Temp 97.9°F | Resp 16 | Ht 62.0 in | Wt 166.0 lb

## 2016-04-30 DIAGNOSIS — M5417 Radiculopathy, lumbosacral region: Secondary | ICD-10-CM | POA: Diagnosis not present

## 2016-04-30 DIAGNOSIS — M5416 Radiculopathy, lumbar region: Secondary | ICD-10-CM

## 2016-04-30 MED ORDER — MELOXICAM 7.5 MG PO TABS: 7.5000 mg | ORAL_TABLET | Freq: Every day | ORAL | Status: DC

## 2016-04-30 MED ORDER — CYCLOBENZAPRINE HCL 5 MG PO TABS: 5.0000 mg | ORAL_TABLET | Freq: Three times a day (TID) | ORAL | Status: DC | PRN

## 2016-04-30 NOTE — Patient Instructions (Signed)
Please begin meloxicam 7.5mg  once daily for back/leg pain. You may use flexeril (muscle relaxer) as needed for muscle spasm. Apply heating pad twice daily to lower back. Call if symptoms worsen, or if no improvement in 2 weeks. Go to the ER if you develop bowel/bladder incontinence.

## 2016-04-30 NOTE — Progress Notes (Signed)
Pre visit review using our clinic review tool, if applicable. No additional management support is needed unless otherwise documented below in the visit note. 

## 2016-04-30 NOTE — Progress Notes (Signed)
Subjective:    Patient ID: Breanna Avery, female    DOB: 18-Oct-1970, 45 y.o.   MRN: [id number]  HPI  Breanna Avery is a 45 yr old female who presents today with chief complaint of pain in the right hip and right leg pain.  Pain has been present x 1 month.  Pain radiates from the Pain has associated limp. Reports that she has tried tylenol, ibuprofen, applied heat and ice with only temporary relief. Denies injury. Denies previous hx of similar symptoms.   Review of Systems  See HPI  Past Medical History  Diagnosis Date  . Diabetes mellitus without complication (HCC)   . Hypertension   . History of herpes genitalis   . HTN (hypertension)   . Cholelithiases 01/25/2014  . Fatty liver 01/25/2014     Social History   Social History  . Marital Status: Single    Spouse Name: N/A  . Number of Children: 3  . Years of Education: N/A   Occupational History  .     Social History Main Topics  . Smoking status: Never Smoker   . Smokeless tobacco: Never Used  . Alcohol Use: Yes     Comment: social  . Drug Use: Not on file  . Sexual Activity: Not on file   Other Topics Concern  . 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 Partial hysterectomy      Family History  Problem Relation Age of Onset  . Hypertension Mother   . Diabetes Father   . Hypertension Sister   . Cancer Maternal Aunt     ovarian  . Cancer Maternal Uncle     lung  . Diabetes Maternal Grandmother   . Diabetes Maternal Grandfather   . Heart disease Neg Hx   . Diabetes Cousin     No Known Allergies  Current Outpatient Prescriptions on File Prior to Visit  Medication Sig Dispense Refill  . ACCU-CHEK FASTCLIX LANCETS MISC Check sugar twice daily. 102 each 1   . albuterol (PROVENTIL HFA;VENTOLIN HFA) 108 (90 BASE) MCG/ACT inhaler Inhale 2 puffs into the lungs every 6 (six) hours as needed for wheezing or shortness of breath. 1 Inhaler 0  . aspirin EC 81 MG tablet Take 81 mg by mouth daily.    . cetirizine (ZYRTEC ALLERGY) 10 MG tablet Take 1 tablet (10 mg total) by mouth daily. 30 tablet 0  . escitalopram (LEXAPRO) 10 MG tablet Take one tab by mouth once daily 30 tablet 5  . ezetimibe (ZETIA) 10 MG tablet Take 1 tablet (10 mg total) by mouth daily. 30 tablet 5  . fluticasone (FLONASE) 50 MCG/ACT nasal spray Place 2 sprays into both nostrils daily. 16 g 6  . Fluticasone-Salmeterol (ADVAIR) 100-50 MCG/DOSE AEPB Inhale 1 puff into the lungs 2 (two) times daily. 1 each 3  . gabapentin (NEURONTIN) 300 MG capsule One caps by mouth every morning and 2 caps at bedtime. 90 capsule 5  . Glucose Blood (BLOOD GLUCOSE TEST STRIPS) STRP Use to check blood sugar twice a day. DX 250.00 100 each 2  . Insulin Glargine (LANTUS SOLOSTAR) 100 UNIT/ML Solostar Pen Inject 20 units SQ QHS (  Patient taking differently: Inject 24 units SQ QHS) 5 pen 2  . Insulin Pen Needle 31G X 5 MM MISC Use 4x a day 300 each 1  . Liraglutide (VICTOZA) 18 MG/3ML SOPN Inject 1.2 mg into the skin daily.    Marland Kitchen lisinopril (PRINIVIL,ZESTRIL) 10 MG tablet Take 1 tablet (10 mg total) by mouth daily. 30 tablet 5  . metFORMIN (GLUCOPHAGE-XR) 500 MG 24 hr tablet Take 2 tablets with breakfast and 2 with dinner. 120 tablet 3  . omeprazole (PRILOSEC) 40 MG capsule TAKE ONE CAPSULE BY MOUTH DAILY 30 capsule 5  . rosuvastatin (CRESTOR) 40 MG tablet Take 1 tablet (40 mg total) by mouth daily. 30 tablet 5  . valACYclovir (VALTREX) 1000 MG tablet Take 1 tablet (1,000 mg total) by mouth daily. 30 tablet 5  . zolpidem (AMBIEN) 5 MG tablet Take 1 tablet (5 mg total) by mouth at bedtime as needed. 30 tablet 0   No current facility-administered medications on file prior to visit.    BP 130/80 mmHg  Pulse 86   Temp(Src) 97.9 F (36.6 C) (Oral)  Resp 16  Ht  (1.575 m)  Wt 166 lb (75.297 kg)  BMI 30.35 kg/m2  SpO2 98%       Objective:   Physical Exam  Constitutional: She is oriented to person, place, and time. She appears well-developed and well-nourished.  HENT:  Head: Normocephalic and atraumatic.  Cardiovascular: Normal rate, regular rhythm and normal heart sounds.   No murmur heard. Pulmonary/Chest: Effort normal and breath sounds normal. No respiratory distress. She has no wheezes.  Musculoskeletal: She exhibits no edema.  + Right lower back tenderness to palpation.  Neurological: She is alert and oriented to person, place, and time.  Reflex Scores:      Patellar reflexes are 2+ on the right side and 2+ on the left side. Bilateral LE strength is 5/5  Skin: Skin is warm and dry.  Psychiatric: She has a normal mood and affect. Her behavior is normal. Judgment and thought content normal.          Assessment & Plan:  R lower back pain with radiculopathy- will rx with meloxicam and prn flexeril. Discussed PT referral, but she wishes to hold off at this time.  Advised pt as follows:   Apply heating pad twice daily to lower back. Call if symptoms worsen, or if no improvement in 2 weeks. Go to the ER if you develop bowel/bladder incontinence.

## 2016-05-12 ENCOUNTER — Other Ambulatory Visit: Payer: Self-pay | Admitting: Family

## 2016-05-18 NOTE — Telephone Encounter (Signed)
Please resend via standard mail delivery. Thanks

## 2016-05-18 NOTE — Telephone Encounter (Signed)
Certified Letter was delivered at [employer] on Mar 06 2016. HIM Dept have not received the certified mail receipt. We do not know if the patient has received the dismissal letter. Please advise  05/18/16 DAJ

## 2016-05-25 ENCOUNTER — Telehealth: Payer: Self-pay | Admitting: Family

## 2016-05-25 NOTE — Telephone Encounter (Signed)
Certified dismissal letter returned as undeliverable, unclaimed, return to sender after three attempts by [employer] on May 25, 2016 Letter placed in another envelope and resent as 1st class mail which does not require a signature. °DAJ °

## 2016-08-11 ENCOUNTER — Other Ambulatory Visit (HOSPITAL_BASED_OUTPATIENT_CLINIC_OR_DEPARTMENT_OTHER): Payer: Self-pay | Admitting: Family Medicine

## 2016-08-11 DIAGNOSIS — Z1231 Encounter for screening mammogram for malignant neoplasm of breast: Secondary | ICD-10-CM

## 2016-08-18 ENCOUNTER — Ambulatory Visit (HOSPITAL_BASED_OUTPATIENT_CLINIC_OR_DEPARTMENT_OTHER)
Admission: RE | Admit: 2016-08-18 | Discharge: 2016-08-18 | Disposition: A | Discharge status: Home / Self Care | Payer: PRIVATE HEALTH INSURANCE | Source: Ambulatory Visit | Attending: Family Medicine | Admitting: Family Medicine

## 2016-08-18 DIAGNOSIS — Z1231 Encounter for screening mammogram for malignant neoplasm of breast: Secondary | ICD-10-CM | POA: Diagnosis not present

## 2016-10-23 ENCOUNTER — Other Ambulatory Visit: Payer: Self-pay | Admitting: Family

## 2016-11-29 ENCOUNTER — Other Ambulatory Visit: Payer: Self-pay | Admitting: Family

## 2016-12-23 ENCOUNTER — Other Ambulatory Visit: Payer: Self-pay | Admitting: Family

## 2017-07-01 IMAGING — CR DG CHEST 2V
2 series · 2 of 2 positions shown · non-contrast
Comparison: None.

CLINICAL DATA: 44-year-old female with chest pain

EXAM:
CHEST  2 VIEW

[w chest pa]
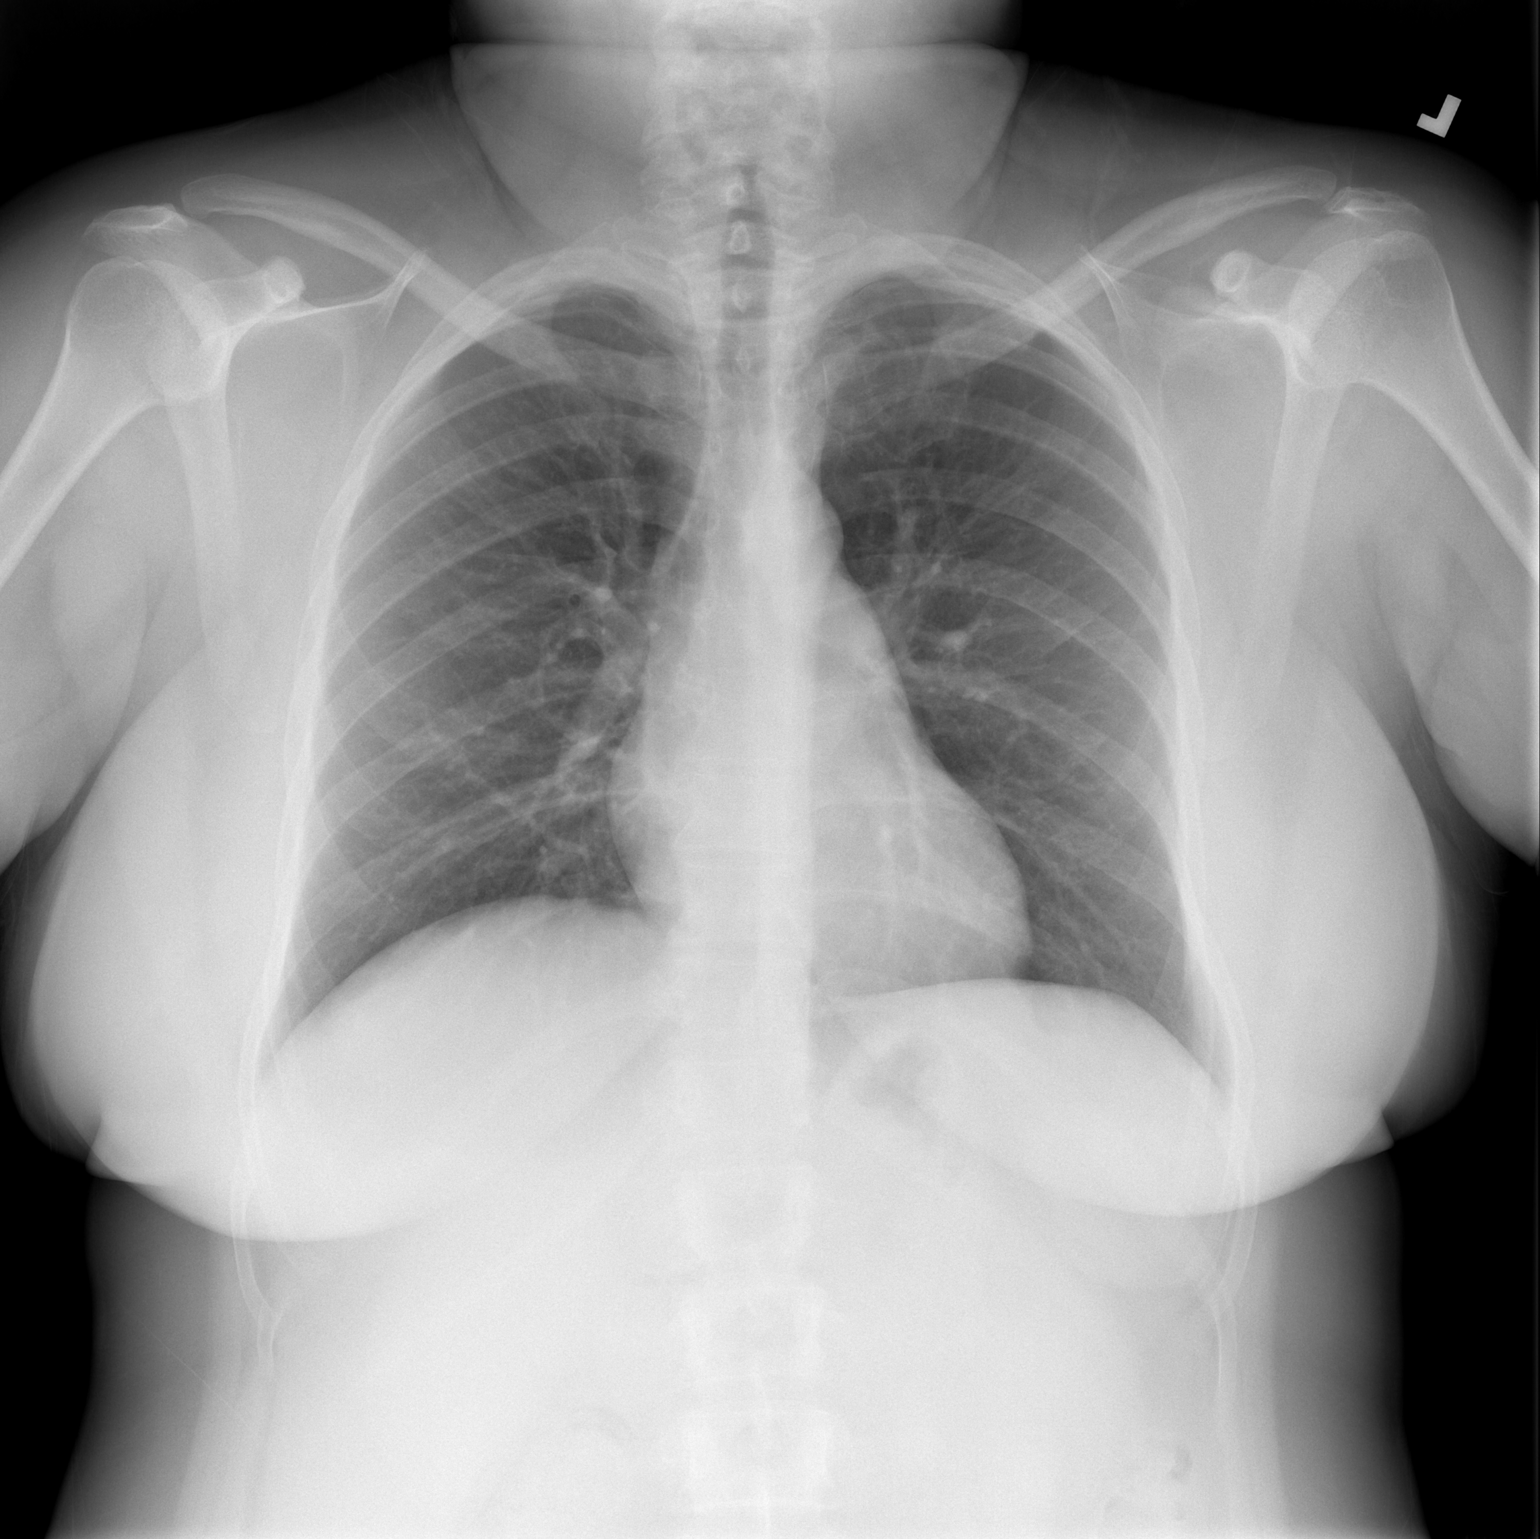

[w chest lat]
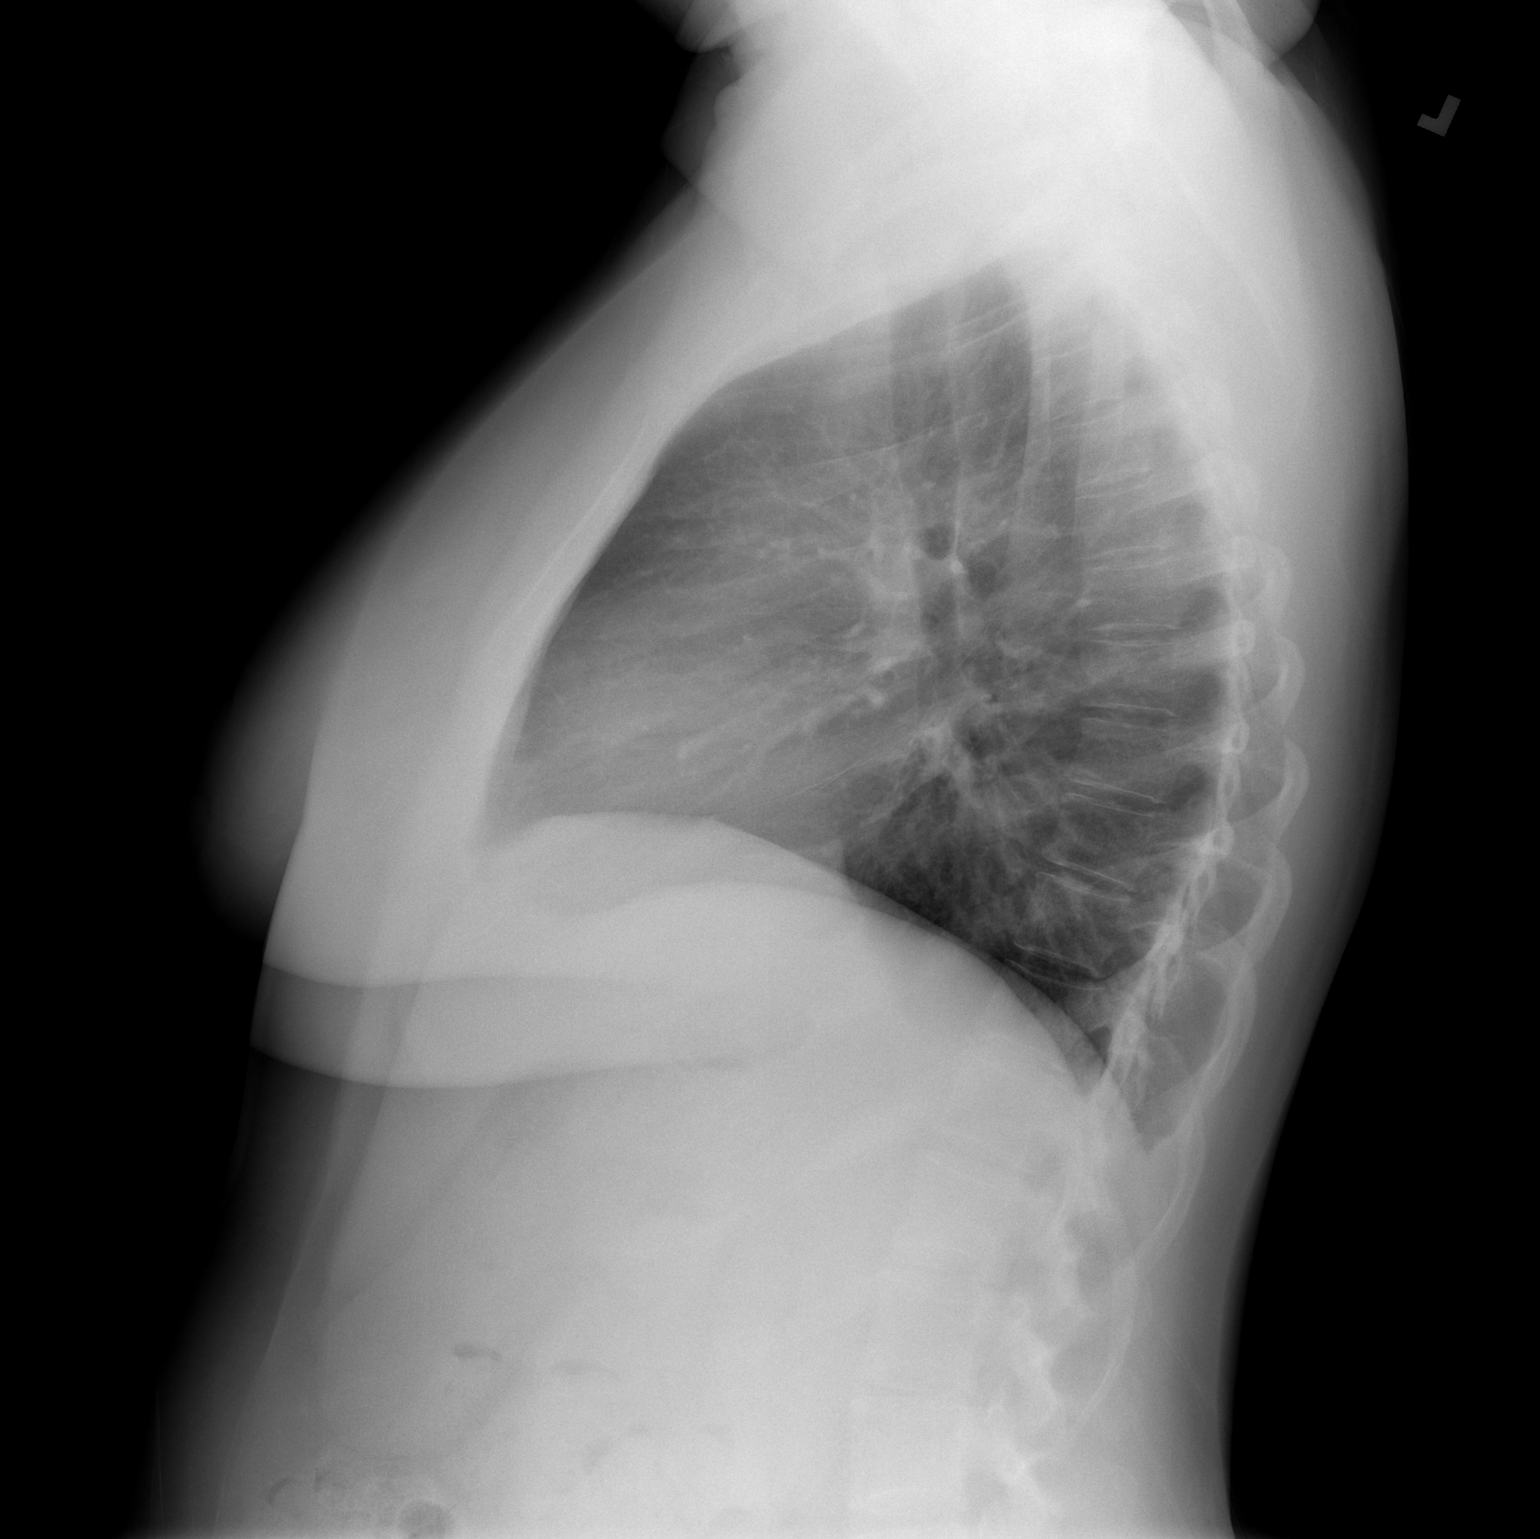

[2 of 2 positions shown; findings below may reference images not displayed]

FINDINGS: The heart size and mediastinal contours are within normal limits.
Both lungs are clear. The visualized skeletal structures are
unremarkable.
IMPRESSION: No active cardiopulmonary disease.

## 2017-07-13 ENCOUNTER — Other Ambulatory Visit (HOSPITAL_BASED_OUTPATIENT_CLINIC_OR_DEPARTMENT_OTHER): Payer: Self-pay | Admitting: Family Medicine

## 2017-07-13 DIAGNOSIS — Z1231 Encounter for screening mammogram for malignant neoplasm of breast: Secondary | ICD-10-CM

## 2017-08-19 ENCOUNTER — Encounter (HOSPITAL_BASED_OUTPATIENT_CLINIC_OR_DEPARTMENT_OTHER): Payer: Self-pay

## 2017-08-19 ENCOUNTER — Ambulatory Visit (HOSPITAL_BASED_OUTPATIENT_CLINIC_OR_DEPARTMENT_OTHER)
Admission: RE | Admit: 2017-08-19 | Discharge: 2017-08-19 | Disposition: A | Discharge status: Home / Self Care | Payer: PRIVATE HEALTH INSURANCE | Source: Ambulatory Visit | Attending: Family Medicine | Admitting: Family Medicine

## 2017-08-19 DIAGNOSIS — Z1231 Encounter for screening mammogram for malignant neoplasm of breast: Secondary | ICD-10-CM | POA: Diagnosis present

## 2017-08-20 ENCOUNTER — Other Ambulatory Visit: Payer: Self-pay | Admitting: Family Medicine

## 2017-08-20 DIAGNOSIS — R928 Other abnormal and inconclusive findings on diagnostic imaging of breast: Secondary | ICD-10-CM

## 2018-03-21 ENCOUNTER — Other Ambulatory Visit: Payer: Self-pay

## 2018-03-21 ENCOUNTER — Emergency Department (HOSPITAL_BASED_OUTPATIENT_CLINIC_OR_DEPARTMENT_OTHER)
Admission: EM | Admit: 2018-03-21 | Discharge: 2018-03-21 | Disposition: A | Discharge status: Home / Self Care | Payer: PRIVATE HEALTH INSURANCE | Attending: Emergency Medicine | Admitting: Emergency Medicine

## 2018-03-21 ENCOUNTER — Encounter (HOSPITAL_BASED_OUTPATIENT_CLINIC_OR_DEPARTMENT_OTHER): Payer: Self-pay

## 2018-03-21 DIAGNOSIS — E1140 Type 2 diabetes mellitus with diabetic neuropathy, unspecified: Secondary | ICD-10-CM | POA: Insufficient documentation

## 2018-03-21 DIAGNOSIS — Z7982 Long term (current) use of aspirin: Secondary | ICD-10-CM | POA: Insufficient documentation

## 2018-03-21 DIAGNOSIS — I10 Essential (primary) hypertension: Secondary | ICD-10-CM | POA: Insufficient documentation

## 2018-03-21 DIAGNOSIS — Z79899 Other long term (current) drug therapy: Secondary | ICD-10-CM | POA: Insufficient documentation

## 2018-03-21 DIAGNOSIS — Z794 Long term (current) use of insulin: Secondary | ICD-10-CM | POA: Insufficient documentation

## 2018-03-21 DIAGNOSIS — R739 Hyperglycemia, unspecified: Secondary | ICD-10-CM

## 2018-03-21 DIAGNOSIS — E1165 Type 2 diabetes mellitus with hyperglycemia: Secondary | ICD-10-CM | POA: Diagnosis not present

## 2018-03-21 DIAGNOSIS — J45909 Unspecified asthma, uncomplicated: Secondary | ICD-10-CM | POA: Insufficient documentation

## 2018-03-21 LAB — URINALYSIS, MICROSCOPIC (REFLEX): RBC / HPF: NONE SEEN RBC/hpf (ref 0–5)

## 2018-03-21 LAB — URINALYSIS, ROUTINE W REFLEX MICROSCOPIC
Bilirubin Urine: NEGATIVE
Glucose, UA: >=500 mg/dL — AB
Hgb urine dipstick: NEGATIVE
Ketones, ur: 15 mg/dL — AB
Leukocytes, UA: NEGATIVE
Nitrite: NEGATIVE
PROTEIN: NEGATIVE mg/dL
Specific Gravity, Urine: 1.02 (ref 1.005–1.030)
pH: 6 (ref 5.0–8.0)

## 2018-03-21 LAB — CBC
HCT: 40 % (ref 36.0–46.0)
Hemoglobin: 13.8 g/dL (ref 12.0–15.0)
MCH: 30.8 pg (ref 26.0–34.0)
MCHC: 34.5 g/dL (ref 30.0–36.0)
MCV: 89.3 fL (ref 78.0–100.0)
PLATELETS: 275 10*3/uL (ref 150–400)
RBC: 4.48 MIL/uL (ref 3.87–5.11)
RDW: 12.2 % (ref 11.5–15.5)
WBC: 8.4 10*3/uL (ref 4.0–10.5)

## 2018-03-21 LAB — BASIC METABOLIC PANEL
Anion gap: 11 (ref 5–15)
BUN: 12 mg/dL (ref 6–20)
CHLORIDE: 97 mmol/L — AB (ref 101–111)
CO2: 27 mmol/L (ref 22–32)
CREATININE: 0.78 mg/dL (ref 0.44–1.00)
Calcium: 9.9 mg/dL (ref 8.9–10.3)
GFR calc non Af Amer: >60 mL/min (ref >60)
GLUCOSE: 397 mg/dL — AB (ref 65–99)
Potassium: 4.3 mmol/L (ref 3.5–5.1)
Sodium: 135 mmol/L (ref 135–145)

## 2018-03-21 LAB — I-STAT VENOUS BLOOD GAS, ED
Acid-Base Excess: 3 mmol/L — ABNORMAL HIGH (ref 0.0–2.0)
BICARBONATE: 29.3 mmol/L — AB (ref 20.0–28.0)
O2 Saturation: 92 %
PCO2 VEN: 49.3 mmHg (ref 44.0–60.0)
TCO2: 31 mmol/L (ref 22–32)
pH, Ven: 7.381 (ref 7.250–7.430)
pO2, Ven: 65 mmHg — ABNORMAL HIGH (ref 32.0–45.0)

## 2018-03-21 LAB — TROPONIN I: Troponin I: <0.03 ng/mL (ref <0.03)

## 2018-03-21 LAB — CBG MONITORING, ED: Glucose-Capillary: 446 mg/dL — ABNORMAL HIGH (ref 65–99)

## 2018-03-21 MED ORDER — SODIUM CHLORIDE 0.9 % IV BOLUS
1000.0000 mL | Freq: Once | INTRAVENOUS | Status: AC
  Administered 2018-03-21: 1000 mL via INTRAVENOUS

## 2018-03-21 NOTE — ED Triage Notes (Signed)
Pt states her CBG >500 over the past week with dizziness, chest tightness, thirsty and urinary frequency. States noitifed PCP and has an appt on the 18th

## 2018-03-21 NOTE — ED Notes (Signed)
Pt

## 2018-03-21 NOTE — Discharge Instructions (Signed)
Your seen here today for hyperglycemia. Your labs are consistent with an elevated blood sugar but you do not appear to be in diabetic ketoacidosis. The remainder of your labs, EKG and urinalysis were reassuring I would like you to continue taking your home diabetic medications as prescribed. Please follow attached handout on diabetic diet.  Please follow up with your endocrinologist during her scheduled appointment on the 18th If you develop worsening or new concerning symptoms you can return to the emergency department for re-evaluation.

## 2018-03-21 NOTE — ED Notes (Signed)
Pt attempting urine sample 

## 2018-03-21 NOTE — ED Provider Notes (Signed)
[facility] EMERGENCY DEPARTMENT Provider Note   CSN: [id number] Arrival date & time: 03/21/18  1803     History   Chief Complaint Chief Complaint  Patient presents with  . Hyperglycemia    HPI Breanna Avery is a 47 y.o. female with a past medical history of insulin dependent DM2, HTN who presents the emergency department today for hyperglycemia.  Patient reports for the last 2 weeks she has been having elevated blood sugars.  She reports that prior to meals her blood sugars usually range between 300-400 after meals they are between 400-500.  She reports she has been taking her insulin and metformin as prescribed.  She reports her last A1c was 7.0 3 months ago (the last I can see was an A1c of 9.2 on 05/26/17).  She does have a follow-up appointment with her diabetic specialist, Dr. Roanna Raider on June 18.  She reports over the last several days she has felt lightheaded, nauseous and has had several episodes of nonbilious, nonbloody emesis.  She reports she has had increased urinary frequency and decreased appetitie.  She is afraid she is not a diabetic crisis.  She reports that she has some chest tightness earlier this morning but denies any chest pain.  She has been taking her medications as prescribed.  She reports that she has been trying to follow a good diabetic diet but occasionally eats sugary foods.  She denies any other complaints at this time.  No fever, visual changes, numbness/tingling/weakness of the extremities, vertigo, chest pain, shortness breath, cough, abdominal pain, diarrhea, lower leg swelling, dysuria, hematuria, flank pain.  HPI  Past Medical History:  Diagnosis Date  . Cholelithiases 01/25/2014  . Diabetes mellitus without complication (HCC)   . Fatty liver 01/25/2014  . History of herpes genitalis   . HTN (hypertension)   . Hypertension     Patient Active Problem List   Diagnosis Date Noted  . Asthma 05/10/2015  . Pain of upper abdomen 03/08/2015  .  Flank pain 09/10/2014  . Fatty liver 01/25/2014  . Cholelithiases 01/25/2014  . Tinea pedis 01/23/2014  . Atypical chest pain 12/05/2013  . Irritability 10/23/2013  . Low back pain radiating to right leg 08/25/2013  . Pain in joint, ankle and foot 08/25/2013  . Skin tag 05/01/2013  . Routine general medical examination at a health care facility 04/11/2013  . Epigastric abdominal tenderness 04/11/2013  . Diabetes type 2, uncontrolled (HCC) 02/28/2013  . Genital herpes 02/28/2013  . Hyperlipidemia 02/28/2013  . Peripheral neuropathy 02/28/2013  . HTN (hypertension) 02/28/2013    Past Surgical History:  Procedure Laterality Date  . PARTIAL HYSTERECTOMY    . REDUCTION MAMMAPLASTY       OB History   None      Home Medications    Prior to Admission medications   Medication Sig Start Date End Date Taking? Authorizing Provider  ACCU-CHEK FASTCLIX LANCETS MISC Check sugar twice daily. 09/10/14   Sandford Craze, NP  albuterol (PROVENTIL HFA;VENTOLIN HFA) 108 (90 BASE) MCG/ACT inhaler Inhale 2 puffs into the lungs every 6 (six) hours as needed for wheezing or shortness of breath. 05/10/15   Sandford Craze, NP  aspirin EC 81 MG tablet Take 81 mg by mouth daily.    [provider]  cetirizine (ZYRTEC ALLERGY) 10 MG tablet Take 1 tablet (10 mg total) by mouth daily. 06/28/15   Palumbo, April, MD  ezetimibe (ZETIA) 10 MG tablet Take 1 tablet (10 mg total) by mouth daily. 05/10/15  Sandford Craze'Sullivan, Melissa, NP  fluticasone (FLONASE) 50 MCG/ACT nasal spray Place 2 sprays into both nostrils daily. 03/06/16   Sandford Craze'Sullivan, Melissa, NP  Fluticasone-Salmeterol (ADVAIR) 100-50 MCG/DOSE AEPB Inhale 1 puff into the lungs 2 (two) times daily. 05/10/15   Sandford Craze'Sullivan, Melissa, NP  gabapentin (NEURONTIN) 300 MG capsule One caps by mouth every morning and 2 caps at bedtime. 05/10/15   Sandford Craze'Sullivan, Melissa, NP  Glucose Blood (BLOOD GLUCOSE TEST STRIPS) STRP Use to check blood sugar twice a day. DX  250.00 09/10/14   Sandford Craze'Sullivan, Melissa, NP  Insulin Glargine (LANTUS SOLOSTAR) 100 UNIT/ML Solostar Pen Inject 20 units SQ QHS Patient taking differently: Inject 24 units SQ QHS 09/17/14   Carlus PavlovGherghe, Cristina, MD  Insulin Pen Needle 31G X 5 MM MISC Use 4x a day 08/05/15   Sandford Craze'Sullivan, Melissa, NP  Liraglutide (VICTOZA) 18 MG/3ML SOPN Inject 1.2 mg into the skin daily. 07/15/15   [provider]  lisinopril (PRINIVIL,ZESTRIL) 10 MG tablet Take 1 tablet (10 mg total) by mouth daily. 05/10/15   Sandford Craze'Sullivan, Melissa, NP  metFORMIN (GLUCOPHAGE-XR) 500 MG 24 hr tablet Take 2 tablets with breakfast and 2 with dinner. 09/17/14   Carlus PavlovGherghe, Cristina, MD  omeprazole (PRILOSEC) 40 MG capsule TAKE ONE CAPSULE BY MOUTH DAILY 11/29/15   Sandford Craze'Sullivan, Melissa, NP  rosuvastatin (CRESTOR) 40 MG tablet Take 1 tablet (40 mg total) by mouth daily. 03/06/16   Sandford Craze'Sullivan, Melissa, NP  valACYclovir (VALTREX) 1000 MG tablet Take 1 tablet (1,000 mg total) by mouth daily. 02/07/16   Sandford Craze'Sullivan, Melissa, NP  zolpidem (AMBIEN) 5 MG tablet Take 1 tablet (5 mg total) by mouth at bedtime as needed. 05/10/15   Sandford Craze'Sullivan, Melissa, NP    Family History Family History  Problem Relation Age of Onset  . Hypertension Mother   . Diabetes Father   . Hypertension Sister   . Cancer Maternal Aunt        ovarian  . Cancer Maternal Uncle        lung  . Diabetes Maternal Grandmother   . Diabetes Maternal Grandfather   . Diabetes Cousin   . Heart disease Neg Hx     Social History Social History   Tobacco Use  . Smoking status: Never Smoker  . Smokeless tobacco: Never Used  Substance Use Topics  . Alcohol use: Not Currently    Comment: social  . Drug use: Not Currently     Allergies   Patient has no known allergies.   Review of Systems Review of Systems  All other systems reviewed and are negative.    Physical Exam Updated Vital Signs BP (!) 136/95   Pulse 83   Temp 98.3 F (36.8 C) (Oral)   Resp 17   Ht 5\' 1"   (1.549 m)   Wt 73.5 kg (162 lb)   SpO2 98%   BMI 30.61 kg/m   Physical Exam  Constitutional: She appears well-developed and well-nourished.  HENT:  Head: Normocephalic and atraumatic.  Right Ear: External ear normal.  Left Ear: External ear normal.  Nose: Nose normal.  Mouth/Throat: Uvula is midline, oropharynx is clear and moist and mucous membranes are normal. No tonsillar exudate.  Eyes: Pupils are equal, round, and reactive to light. Right eye exhibits no discharge. Left eye exhibits no discharge. No scleral icterus.  Neck: Trachea normal. Neck supple. No spinous process tenderness present. No neck rigidity. Normal range of motion present.  Cardiovascular: Normal rate, regular rhythm and intact distal pulses.  No murmur heard. Pulses:  Radial pulses are 2+ on the right side, and 2+ on the left side.       Dorsalis pedis pulses are 2+ on the right side, and 2+ on the left side.       Posterior tibial pulses are 2+ on the right side, and 2+ on the left side.  No lower extremity swelling or edema. Calves symmetric in size bilaterally.  Pulmonary/Chest: Effort normal and breath sounds normal. She exhibits no tenderness.  Abdominal: Soft. Bowel sounds are normal. There is no tenderness. There is no rigidity, no rebound, no guarding and no CVA tenderness.  Musculoskeletal: She exhibits no edema.  Lymphadenopathy:    She has no cervical adenopathy.  Neurological: She is alert.  Mental Status:  Alert, oriented, thought content appropriate, able to give a coherent history. Speech fluent without evidence of aphasia. Able to follow 2 step commands without difficulty.  Cranial Nerves:  II:  Peripheral visual fields grossly normal, pupils equal, round, reactive to light III,IV, VI: ptosis not present, extra-ocular motions intact bilaterally  V,VII: smile symmetric, eyebrows raise symmetric, facial light touch sensation equal VIII: hearing grossly normal to voice  X: uvula elevates  symmetrically  XI: bilateral shoulder shrug symmetric and strong XII: midline tongue extension without fassiculations Motor:  Normal tone. 5/5 in upper and lower extremities bilaterally including strong and equal grip strength and dorsiflexion/plantar flexion Sensory: Sensation intact to light touch in all extremities. Negative Romberg.  Deep Tendon Reflexes: 2+ and symmetric in the biceps and patella Cerebellar: normal finger-to-nose with bilateral upper extremities. Normal heel-to -shin balance bilaterally of the lower extremity. No pronator drift.  Gait: normal gait and balance CV: distal pulses palpable throughout   Skin: Skin is warm and dry. No rash noted. She is not diaphoretic.  Psychiatric: She has a normal mood and affect.  Nursing note and vitals reviewed.    ED Treatments / Results  Labs (all labs ordered are listed, but only abnormal results are displayed) Labs Reviewed  BASIC METABOLIC PANEL - Abnormal; Notable for the following components:      Result Value   Chloride 97 (*)    Glucose, Bld 397 (*)    All other components within normal limits  URINALYSIS, ROUTINE W REFLEX MICROSCOPIC - Abnormal; Notable for the following components:   Glucose, UA >=500 (*)    Ketones, ur 15 (*)    All other components within normal limits  URINALYSIS, MICROSCOPIC (REFLEX) - Abnormal; Notable for the following components:   Bacteria, UA RARE (*)    All other components within normal limits  CBG MONITORING, ED - Abnormal; Notable for the following components:   Glucose-Capillary 446 (*)    All other components within normal limits  I-STAT VENOUS BLOOD GAS, ED - Abnormal; Notable for the following components:   pO2, Ven 65.0 (*)    Bicarbonate 29.3 (*)    Acid-Base Excess 3.0 (*)    All other components within normal limits  CBC  TROPONIN I    EKG EKG Interpretation  Date/Time:  Monday March 21 2018 19:20:55 EDT Ventricular Rate:  81 PR Interval:    QRS Duration: 83 QT  Interval:  401 QTC Calculation: 466 R Axis:   -6 Text Interpretation:  Sinus rhythm No old tracing to compare Confirmed by Rolan Bucco [phone]) on 03/22/2018 9:11:11 AM   Radiology No results found.  Procedures Procedures (including critical care time)  Medications Ordered in ED Medications  sodium chloride 0.9 % bolus 1,000 mL (0  mLs Intravenous Stopped 03/21/18 2029)     Initial Impression / Assessment and Plan / ED Course  I have reviewed the triage vital signs and the nursing notes.  Pertinent labs & imaging results that were available during my care of the patient were reviewed by me and considered in my medical decision making (see chart for details).     47 year old female history of insulin-dependent diabetes who presents with hyperglycemia.  Patient reports that she has been having sugars between 300-500 the last several weeks.  She has been taking her medications as prescribed.  Her last A1c unable to see was 9.2 on 8/15.  She does have a follow-up appointment with her diabetic specialist, Dr. Roanna Raider on June 18.  She reports that she had some lightheadedness, increased thirst as well as urinary frequency and nausea, emesis over the last several days.  She had an episode of chest tightness earlier today without any chest pain or shortness of breath.  Denies any neurologic symptoms.  No vertigo.  Neuro exam is without any focal deficits.  Exam is reassuring as above.  Heart is regular rate and rhythm.  Lungs are clear to auscultation bilaterally.  Abdomen is soft and nontender.  Vital signs are reassuring.  EKG NSR.  CBG is elevated at 446.  Obtain lab work and VBG. Will give liter of IVF  Without evidence of UTI.  No leukocytosis.  No anemia.  No acute kidney injury.  BMP without anion gap acidosis.  VBG without evidence of DKA.  Troponin within normal limits.  Patient symptoms atypical for ACS.  The evaluation does not show pathology that would require ongoing emergent  intervention or inpatient treatment. Will have patient continue home medications. Patient is to adjust diet. Handout given. I advised the patient to follow-up with diabetic specialist on 03/29/18. I advised the patient to return to the emergency department with new or worsening symptoms or new concerns. Specific return precautions discussed. The patient verbalized understanding and agreement with plan. All questions answered. No further questions at this time. The patient is hemodynamically stable, mentating appropriately and appears safe for discharge.  Final Clinical Impressions(s) / ED Diagnoses   Final diagnoses:  Hyperglycemia    ED Discharge Orders    None       Jacinto Halim, Cordelia Poche 03/22/18 1421    Tegeler, Canary Brim, MD 03/22/18 1504

## 2018-08-15 ENCOUNTER — Other Ambulatory Visit (HOSPITAL_BASED_OUTPATIENT_CLINIC_OR_DEPARTMENT_OTHER): Payer: Self-pay | Admitting: Internal Medicine

## 2018-08-15 DIAGNOSIS — Z1231 Encounter for screening mammogram for malignant neoplasm of breast: Secondary | ICD-10-CM

## 2018-09-07 ENCOUNTER — Ambulatory Visit (HOSPITAL_BASED_OUTPATIENT_CLINIC_OR_DEPARTMENT_OTHER): Payer: PRIVATE HEALTH INSURANCE

## 2018-09-10 ENCOUNTER — Ambulatory Visit (HOSPITAL_BASED_OUTPATIENT_CLINIC_OR_DEPARTMENT_OTHER): Payer: PRIVATE HEALTH INSURANCE

## 2019-12-11 ENCOUNTER — Other Ambulatory Visit: Payer: Self-pay

## 2019-12-11 ENCOUNTER — Encounter (HOSPITAL_BASED_OUTPATIENT_CLINIC_OR_DEPARTMENT_OTHER): Payer: Self-pay | Admitting: Emergency Medicine

## 2019-12-11 ENCOUNTER — Emergency Department (HOSPITAL_BASED_OUTPATIENT_CLINIC_OR_DEPARTMENT_OTHER): Payer: PRIVATE HEALTH INSURANCE

## 2019-12-11 ENCOUNTER — Emergency Department (HOSPITAL_BASED_OUTPATIENT_CLINIC_OR_DEPARTMENT_OTHER)
Admission: EM | Admit: 2019-12-11 | Discharge: 2019-12-11 | Disposition: A | Discharge status: Home / Self Care | Payer: PRIVATE HEALTH INSURANCE | Attending: Emergency Medicine | Admitting: Emergency Medicine

## 2019-12-11 DIAGNOSIS — M542 Cervicalgia: Secondary | ICD-10-CM | POA: Diagnosis present

## 2019-12-11 DIAGNOSIS — E119 Type 2 diabetes mellitus without complications: Secondary | ICD-10-CM | POA: Insufficient documentation

## 2019-12-11 DIAGNOSIS — L0211 Cutaneous abscess of neck: Secondary | ICD-10-CM | POA: Insufficient documentation

## 2019-12-11 DIAGNOSIS — J45909 Unspecified asthma, uncomplicated: Secondary | ICD-10-CM | POA: Insufficient documentation

## 2019-12-11 DIAGNOSIS — L0291 Cutaneous abscess, unspecified: Secondary | ICD-10-CM

## 2019-12-11 DIAGNOSIS — Z7982 Long term (current) use of aspirin: Secondary | ICD-10-CM | POA: Insufficient documentation

## 2019-12-11 DIAGNOSIS — Z794 Long term (current) use of insulin: Secondary | ICD-10-CM | POA: Diagnosis not present

## 2019-12-11 DIAGNOSIS — I10 Essential (primary) hypertension: Secondary | ICD-10-CM | POA: Diagnosis not present

## 2019-12-11 DIAGNOSIS — L03221 Cellulitis of neck: Secondary | ICD-10-CM | POA: Diagnosis not present

## 2019-12-11 DIAGNOSIS — Z79899 Other long term (current) drug therapy: Secondary | ICD-10-CM | POA: Diagnosis not present

## 2019-12-11 DIAGNOSIS — R911 Solitary pulmonary nodule: Secondary | ICD-10-CM | POA: Diagnosis not present

## 2019-12-11 LAB — CBC WITH DIFFERENTIAL/PLATELET
Abs Immature Granulocytes: 0.01 10*3/uL (ref 0.00–0.07)
Basophils Absolute: 0.1 10*3/uL (ref 0.0–0.1)
Basophils Relative: 1 %
Eosinophils Absolute: 0.2 10*3/uL (ref 0.0–0.5)
Eosinophils Relative: 2 %
HCT: 41 % (ref 36.0–46.0)
Hemoglobin: 13.2 g/dL (ref 12.0–15.0)
Immature Granulocytes: 0 %
Lymphocytes Relative: 56 %
Lymphs Abs: 5.2 10*3/uL — ABNORMAL HIGH (ref 0.7–4.0)
MCH: 29.6 pg (ref 26.0–34.0)
MCHC: 32.2 g/dL (ref 30.0–36.0)
MCV: 91.9 fL (ref 80.0–100.0)
Monocytes Absolute: 0.4 10*3/uL (ref 0.1–1.0)
Monocytes Relative: 5 %
Neutro Abs: 3.2 10*3/uL (ref 1.7–7.7)
Neutrophils Relative %: 36 %
Platelets: 275 10*3/uL (ref 150–400)
RBC: 4.46 MIL/uL (ref 3.87–5.11)
RDW: 12.6 % (ref 11.5–15.5)
WBC: 9.1 10*3/uL (ref 4.0–10.5)
nRBC: 0 % (ref 0.0–0.2)

## 2019-12-11 LAB — BASIC METABOLIC PANEL
Anion gap: 9 (ref 5–15)
BUN: 11 mg/dL (ref 6–20)
CO2: 24 mmol/L (ref 22–32)
Calcium: 10.3 mg/dL (ref 8.9–10.3)
Chloride: 99 mmol/L (ref 98–111)
Creatinine, Ser: 0.61 mg/dL (ref 0.44–1.00)
GFR calc Af Amer: >60 mL/min (ref >60)
GFR calc non Af Amer: >60 mL/min (ref >60)
Glucose, Bld: 318 mg/dL — ABNORMAL HIGH (ref 70–99)
Potassium: 4.3 mmol/L (ref 3.5–5.1)
Sodium: 132 mmol/L — ABNORMAL LOW (ref 135–145)

## 2019-12-11 MED ORDER — IOHEXOL 300 MG/ML  SOLN
75.0000 mL | Freq: Once | INTRAMUSCULAR | Status: AC | PRN
  Administered 2019-12-11: 19:00:00 75 mL via INTRAVENOUS

## 2019-12-11 MED ORDER — CLINDAMYCIN HCL 150 MG PO CAPS: 450.0000 mg | ORAL_CAPSULE | Freq: Three times a day (TID) | ORAL | 0 refills | Status: AC

## 2019-12-11 MED ORDER — HYDROCODONE-ACETAMINOPHEN 5-325 MG PO TABS: 1.0000 | ORAL_TABLET | Freq: Four times a day (QID) | ORAL | 0 refills | Status: DC | PRN

## 2019-12-11 MED ORDER — CLINDAMYCIN HCL 150 MG PO CAPS: 450.0000 mg | ORAL_CAPSULE | Freq: Three times a day (TID) | ORAL | 0 refills | Status: DC

## 2019-12-11 MED ORDER — CLINDAMYCIN HCL 150 MG PO CAPS
450.0000 mg | ORAL_CAPSULE | Freq: Once | ORAL | Status: AC
  Administered 2019-12-11: 450 mg via ORAL
  Filled 2019-12-11: qty 3

## 2019-12-11 NOTE — ED Triage Notes (Signed)
Lump on left side of neck since last Wed after receiving COVID 2nd vaccine. No other sx.

## 2019-12-11 NOTE — ED Notes (Signed)
Pt had partial hysterectomy. Urine pregnancy discontinued.

## 2019-12-11 NOTE — Discharge Instructions (Signed)
Take antibiotics as directed. Please take all of your antibiotics until finished.  Take pain medications as directed for break through pain. Do not drive or operate machinery while taking this medication.   You can take Tylenol or Ibuprofen as directed for pain. You can alternate Tylenol and Ibuprofen every 4 hours. If you take Tylenol at 1pm, then you can take Ibuprofen at 5pm. Then you can take Tylenol again at 9pm.   As we discussed, your your glucose was high here today.  Take your insulin when you go home.  Additionally, on her CT scan, there is a 5 mm pulmonary nodule in the left upper lung.  There is nothing to do about this today.  Notify your primary care doctor about this and have him follow-up.  Return emergency department for any fever, worsening pain, redness, swelling of the neck, difficulty moving your arms or legs or any other worsening or concerning symptoms.

## 2019-12-11 NOTE — ED Provider Notes (Signed)
[facility] Provider Note   CSN: [id number] Arrival date & time: 12/11/19  1530     History Chief Complaint  Patient presents with  . Neck Pain    Breanna Avery is a 49 y.o. female has no history of cholelithiasis, diabetes, hypertension who presents for evaluation of pain to the posterior aspect of her left neck that has been ongoing for about a week.  She states that she had had a bump there that was small in nature for the last several years.  She reports last Tuesday, she got her second Covid vaccine.  She states that the next day, she started having, pain, swelling noted to the posterior aspect of the left neck.  She states since then, the area is gone larger, more painful.  She has not noticed any overlying warmth or erythema.  No fevers.  She does states she has a history of diabetes and is on insulin.  She states she is compliant with her medications and states she checks her sugars daily and they have been good.  She denies any numbness/weakness of arms or legs, vomiting, chest pain, difficulty breathing.  The history is provided by the patient.       Past Medical History:  Diagnosis Date  . Cholelithiases 01/25/2014  . Diabetes mellitus without complication ([facility])   . Fatty liver 01/25/2014  . History of herpes genitalis   . HTN (hypertension)   . Hypertension     Patient Active Problem List   Diagnosis Date Noted  . Asthma 05/10/2015  . Pain of upper abdomen 03/08/2015  . Flank pain 09/10/2014  . Fatty liver 01/25/2014  . Cholelithiases 01/25/2014  . Tinea pedis 01/23/2014  . Atypical chest pain 12/05/2013  . Irritability 10/23/2013  . Low back pain radiating to right leg 08/25/2013  . Pain in joint, ankle and foot 08/25/2013  . Skin tag 05/01/2013  . Routine general medical examination at a health care facility 04/11/2013  . Epigastric abdominal tenderness 04/11/2013  . Diabetes type 2, uncontrolled ([facility]) 02/28/2013  . Genital  herpes 02/28/2013  . Hyperlipidemia 02/28/2013  . Peripheral neuropathy 02/28/2013  . HTN (hypertension) 02/28/2013    Past Surgical History:  Procedure Laterality Date  . PARTIAL HYSTERECTOMY    . REDUCTION MAMMAPLASTY       OB History   No obstetric history on file.     Family History  Problem Relation Age of Onset  . Hypertension Mother   . Diabetes Father   . Hypertension Sister   . Cancer Maternal Aunt        ovarian  . Cancer Maternal Uncle        lung  . Diabetes Maternal Grandmother   . Diabetes Maternal Grandfather   . Diabetes Cousin   . Heart disease Neg Hx     Social History   Tobacco Use  . Smoking status: Never Smoker  . Smokeless tobacco: Never Used  Substance Use Topics  . Alcohol use: Not Currently    Comment: social  . Drug use: Not Currently    Home Medications Prior to Admission medications   Medication Sig Start Date End Date Taking? Authorizing Provider  ACCU-CHEK FASTCLIX LANCETS MISC Check sugar twice daily. 09/10/14   Debbrah Alar, NP  albuterol (PROVENTIL HFA;VENTOLIN HFA) 108 (90 BASE) MCG/ACT inhaler Inhale 2 puffs into the lungs every 6 (six) hours as needed for wheezing or shortness of breath. 05/10/15   Debbrah Alar, NP  aspirin EC 81  MG tablet Take 81 mg by mouth daily.    [provider]  cetirizine (ZYRTEC ALLERGY) 10 MG tablet Take 1 tablet (10 mg total) by mouth daily. 06/28/15   Palumbo, April, MD  clindamycin (CLEOCIN) 150 MG capsule Take 3 capsules (450 mg total) by mouth 3 (three) times daily for 7 days. 12/11/19 12/18/19  Maxwell Caul, PA-C  ezetimibe (ZETIA) 10 MG tablet Take 1 tablet (10 mg total) by mouth daily. 05/10/15   Sandford Craze, NP  fluticasone (FLONASE) 50 MCG/ACT nasal spray Place 2 sprays into both nostrils daily. 03/06/16   Sandford Craze, NP  Fluticasone-Salmeterol (ADVAIR) 100-50 MCG/DOSE AEPB Inhale 1 puff into the lungs 2 (two) times daily. 05/10/15   Sandford Craze, NP   gabapentin (NEURONTIN) 300 MG capsule One caps by mouth every morning and 2 caps at bedtime. 05/10/15   Sandford Craze, NP  Glucose Blood (BLOOD GLUCOSE TEST STRIPS) STRP Use to check blood sugar twice a day. DX 250.00 09/10/14   Sandford Craze, NP  HYDROcodone-acetaminophen (NORCO/VICODIN) 5-325 MG tablet Take 1-2 tablets by mouth every 6 (six) hours as needed. 12/11/19   Maxwell Caul, PA-C  Insulin Glargine (LANTUS SOLOSTAR) 100 UNIT/ML Solostar Pen Inject 20 units SQ QHS Patient taking differently: Inject 24 units SQ QHS 09/17/14   Carlus Pavlov, MD  Insulin Pen Needle 31G X 5 MM MISC Use 4x a day 08/05/15   Sandford Craze, NP  Liraglutide (VICTOZA) 18 MG/3ML SOPN Inject 1.2 mg into the skin daily. 07/15/15   [provider]  lisinopril (PRINIVIL,ZESTRIL) 10 MG tablet Take 1 tablet (10 mg total) by mouth daily. 05/10/15   Sandford Craze, NP  metFORMIN (GLUCOPHAGE-XR) 500 MG 24 hr tablet Take 2 tablets with breakfast and 2 with dinner. 09/17/14   Carlus Pavlov, MD  omeprazole (PRILOSEC) 40 MG capsule TAKE ONE CAPSULE BY MOUTH DAILY 11/29/15   Sandford Craze, NP  rosuvastatin (CRESTOR) 40 MG tablet Take 1 tablet (40 mg total) by mouth daily. 03/06/16   Sandford Craze, NP  valACYclovir (VALTREX) 1000 MG tablet Take 1 tablet (1,000 mg total) by mouth daily. 02/07/16   Sandford Craze, NP  zolpidem (AMBIEN) 5 MG tablet Take 1 tablet (5 mg total) by mouth at bedtime as needed. 05/10/15   Sandford Craze, NP    Allergies    Patient has no known allergies.  Review of Systems   Review of Systems  Constitutional: Negative for fever.  Respiratory: Negative for shortness of breath.   Cardiovascular: Negative for chest pain.  Gastrointestinal: Negative for abdominal pain, nausea and vomiting.  Musculoskeletal: Positive for neck pain.  Skin: Positive for wound. Negative for color change.  Neurological: Negative for weakness and numbness.  All other  systems reviewed and are negative.   Physical Exam Updated Vital Signs BP (!) 151/88 (BP Location: Left Arm)   Pulse 76   Temp 98.2 F (36.8 C) (Oral)   Resp 20   Ht 5\' 1"  (1.549 m)   Wt 73.5 kg   SpO2 99%   BMI 30.61 kg/m   Physical Exam Vitals and nursing note reviewed.  Constitutional:      Appearance: Normal appearance. She is well-developed.  HENT:     Head: Normocephalic and atraumatic.  Eyes:     General: Lids are normal.     Conjunctiva/sclera: Conjunctivae normal.     Pupils: Pupils are equal, round, and reactive to light.  Neck:      Comments: Ancanthosis nigricans noted to posterior neck.  There is a about a 3 x 3 cm area of induration, pain, tenderness noted to the upper left posterior neck where the neck and occiput meet.  No fluctuance noted.  Full range of motion of neck without any difficulty. Cardiovascular:     Rate and Rhythm: Normal rate and regular rhythm.     Pulses: Normal pulses.     Heart sounds: Normal heart sounds. No murmur. No friction rub. No gallop.   Pulmonary:     Effort: Pulmonary effort is normal.     Breath sounds: Normal breath sounds.  Abdominal:     Palpations: Abdomen is soft. Abdomen is not rigid.     Tenderness: There is no abdominal tenderness. There is no guarding.  Musculoskeletal:        General: Normal range of motion.  Skin:    General: Skin is warm and dry.     Capillary Refill: Capillary refill takes less than 2 seconds.  Neurological:     Mental Status: She is alert and oriented to person, place, and time.     Comments: MAE 5/5 strength of BUE and BLE  Psychiatric:        Speech: Speech normal.     ED Results / Procedures / Treatments   Labs (all labs ordered are listed, but only abnormal results are displayed) Labs Reviewed  BASIC METABOLIC PANEL - Abnormal; Notable for the following components:      Result Value   Sodium 132 (*)    Glucose, Bld 318 (*)    All other components within normal limits  CBC  WITH DIFFERENTIAL/PLATELET - Abnormal; Notable for the following components:   Lymphs Abs 5.2 (*)    All other components within normal limits    EKG None  Radiology CT Soft Tissue Neck W Contrast  Result Date: 12/11/2019 CLINICAL DATA:  Initial evaluation for left-sided neck mass for several days. EXAM: CT NECK WITH CONTRAST TECHNIQUE: Multidetector CT imaging of the neck was performed using the standard protocol following the bolus administration of intravenous contrast. CONTRAST:  58mL OMNIPAQUE IOHEXOL 300 MG/ML  SOLN COMPARISON:  None available. FINDINGS: Pharynx and larynx: Oral cavity within normal limits without discrete mass or collection. No acute abnormality about the dentition. Oropharynx and nasopharynx within normal limits. No retropharyngeal collection. Epiglottis within normal limits. Vallecula largely effaced by the lingual tonsils. Remainder of the hypopharynx and supraglottic larynx within normal limits. Glottis unremarkable. Subglottic airway clear. Salivary glands: Salivary glands including the parotid glands and submandibular glands are within normal limits. Thyroid: Thyroid within normal limits. Lymph nodes: Mild asymmetric prominence of subcentimeter left-sided cervical lymph nodes, likely reactive. No other pathologically enlarged or abnormal adenopathy seen within the neck. No enlarged lymph nodes seen within the upper mediastinum. Multiple prominent right-sided axillary nodes seen, with mildly enlarged 1.3 cm right axillary node (series 2, image 96), indeterminate. Vascular: Normal intravascular enhancement seen throughout the neck. Limited intracranial: Unremarkable. Visualized orbits: Visualized globes and orbital soft tissues within normal limits. Mastoids and visualized paranasal sinuses: Visualized paranasal sinuses are clear. Visualized mastoids and middle ear cavities are well pneumatized and free of fluid. Skeleton: No acute osseous abnormality. No discrete lytic or  blastic osseous lesions. Upper chest: 5 mm ground-glass nodule present at the posterior left upper lobe (series 7, image 86). Partially visualized upper chest demonstrates no other acute abnormality. Other: Metallic BB marker overlies a palpable abnormality at the left posterior neck. In this region, there is a well-circumscribed hypodense lesion measuring 1.7  x 1.6 x 1.4 cm (series 2, image 15). Findings suggestive of a small abscess and/or sebaceous cyst. Surrounding inflammatory stranding within the adjacent subcutaneous fat consistent with regional cellulitis. No significant extension into the underlying musculature or deeper spaces of the neck at this time. IMPRESSION: 1. 1.7 x 1.6 x 1.4 cm hypodense lesion within the left posterior neck, consistent with a small abscess and/or infected sebaceous cyst. Surrounding inflammatory stranding consistent with regional cellulitis. 2. Mildly prominent enlarged right axillary adenopathy as above, indeterminate, but could be reactive. Clinical follow-up to resolution recommended. 3. 5 mm ground-glass left upper lobe nodule, indeterminate. No follow-up needed if patient is low-risk. Non-contrast chest CT can be considered in 12 months if patient is high-risk. This recommendation follows the consensus statement: Guidelines for Management of Incidental Pulmonary Nodules Detected on CT Images: From the Fleischner Society 2017; Radiology 2017; 284:228-243. Electronically Signed   By: Rise Mu M.D.   On: 12/11/2019 19:16    Procedures Procedures (including critical care time)  Medications Ordered in ED Medications  iohexol (OMNIPAQUE) 300 MG/ML solution 75 mL (75 mLs Intravenous Contrast Given 12/11/19 1851)  clindamycin (CLEOCIN) capsule 450 mg (450 mg Oral Given 12/11/19 2037)    ED Course  I have reviewed the triage vital signs and the nursing notes.  Pertinent labs & imaging results that were available during my care of the patient were reviewed by me  and considered in my medical decision making (see chart for details).    MDM Rules/Calculators/A&P                       49 year old female who presents for evaluation of pain, swelling noted to posterior left neck x1 week.  No fevers.  History of diabetes.  She states it began after getting her Covid vaccine.  On initial arrival, she is afebrile, nontoxic-appearing.  Vital signs are stable.  She has a 3 x 3 cm area of induration, tenderness noted to the left posterior neck.  Numbness/weakness.  Concern for abscess versus mass.  Plan to check labs, CT soft tissue neck.  CBC shows no leukocytosis. BMP shows normal BUN and Cr. Glucose is 318.   CT neck shows a 1.7 x 1.6 x 1.4 cm area consistent with a small abscess vs sebaceous cyst.  Surrounding inflammatory stranding consistent with regional cellulitis.  There is some prominent enlarged right axillary adenopathy as above.  There is also a 5 mm left upper lobe nodule.  At this time, the area does not appear superficial and appears slightly more deeper than what would be easily accessible here in the ED.  Will discuss with ENT regarding antibiotics and follow-up in the office rather than I&D here in the ED.  Discussed with Dr. Pollyann Kennedy (ENT).  He with plan for antibiotics and ENT follow-up.  He recommends clindamycin.  Patient can call their office tomorrow and arrange for follow-up.  I discussed results with patient.  I did discuss with patient regarding the pulmonary nodule seen on her CT.  Patient with no known drug allergies.  We will plan to start her on clindamycin.  Instructed her to follow-up with ENT as directed tomorrow. At this time, patient exhibits no emergent life-threatening condition that require further evaluation in ED or admission. Patient had ample opportunity for questions and discussion. All patient's questions were answered with full understanding. Strict return precautions discussed. Patient expresses understanding and agreement to  plan.    Portions of this note were  generated with Scientist, clinical (histocompatibility and immunogenetics). Dictation errors may occur despite best attempts at proofreading.   Final Clinical Impression(s) / ED Diagnoses Final diagnoses:  Abscess  Cellulitis of neck  Pulmonary nodule    Rx / DC Orders ED Discharge Orders         Ordered    clindamycin (CLEOCIN) 150 MG capsule  3 times daily,   Status:  Discontinued     12/11/19 2037    HYDROcodone-acetaminophen (NORCO/VICODIN) 5-325 MG tablet  Every 6 hours PRN,   Status:  Discontinued     12/11/19 2037    clindamycin (CLEOCIN) 150 MG capsule  3 times daily     12/11/19 2037    HYDROcodone-acetaminophen (NORCO/VICODIN) 5-325 MG tablet  Every 6 hours PRN     12/11/19 2037           Maxwell Caul, PA-C 12/11/19 2354    Little, Ambrose Finland, MD 12/14/19 [phone]

## 2020-01-11 ENCOUNTER — Other Ambulatory Visit: Payer: Self-pay | Admitting: Otolaryngology

## 2020-01-11 DIAGNOSIS — L0211 Cutaneous abscess of neck: Secondary | ICD-10-CM

## 2020-01-22 ENCOUNTER — Ambulatory Visit
Admission: RE | Admit: 2020-01-22 | Discharge: 2020-01-22 | Disposition: A | Discharge status: Home / Self Care | Payer: PRIVATE HEALTH INSURANCE | Source: Ambulatory Visit | Attending: Otolaryngology | Admitting: Otolaryngology

## 2020-01-22 DIAGNOSIS — L0211 Cutaneous abscess of neck: Secondary | ICD-10-CM

## 2020-01-22 MED ORDER — IOPAMIDOL (ISOVUE-300) INJECTION 61%
75.0000 mL | Freq: Once | INTRAVENOUS | Status: AC | PRN
  Administered 2020-01-22: 75 mL via INTRAVENOUS

## 2020-02-08 ENCOUNTER — Other Ambulatory Visit: Payer: Self-pay | Admitting: Otolaryngology

## 2020-02-14 ENCOUNTER — Institutional Professional Consult (permissible substitution): Payer: PRIVATE HEALTH INSURANCE | Admitting: Pulmonary Disease

## 2020-03-13 ENCOUNTER — Institutional Professional Consult (permissible substitution): Payer: PRIVATE HEALTH INSURANCE | Admitting: Internal Medicine

## 2020-03-14 ENCOUNTER — Telehealth: Payer: Self-pay | Admitting: Internal Medicine

## 2020-03-14 NOTE — Telephone Encounter (Signed)
Former Patient of Peggyann Juba is requesting if we have  A shot record for  Measle, Mumps or Rubella on file. Please advise

## 2020-03-14 NOTE — Telephone Encounter (Signed)
Left detail message for patient to be aware we do not have records of administrating MMR. If she needs anything else she can call us back. 

## 2020-05-07 ENCOUNTER — Ambulatory Visit (INDEPENDENT_AMBULATORY_CARE_PROVIDER_SITE_OTHER): Payer: BLUE CROSS/BLUE SHIELD | Admitting: Pulmonary Disease

## 2020-05-07 ENCOUNTER — Encounter: Payer: Self-pay | Admitting: Pulmonary Disease

## 2020-05-07 ENCOUNTER — Other Ambulatory Visit: Payer: Self-pay

## 2020-05-07 VITALS — BP 122/76 | HR 83 | Temp 98.2°F | Resp 16 | Ht 61.0 in | Wt 155.0 lb

## 2020-05-07 DIAGNOSIS — G4733 Obstructive sleep apnea (adult) (pediatric): Secondary | ICD-10-CM

## 2020-05-07 NOTE — Progress Notes (Signed)
Breanna Avery    [id number]    06-18-1971  Primary Care Physician:Drosinis, Leonia Reader, PA-C  Referring Physician: Verlon Au, MD [address] [place],  [place] [address]  Chief complaint:   Patient with a history of snoring  HPI:  History of snoring of many years Has become louder lately is why she is being evaluated currently  Usually goes to bed between 9 and 10 PM About 20 to 30 minutes to fall asleep Wakes up about 1-2 times during the night for multiple reasons Final awakening time about 6 AM  Weight is up about 6 to 10 pounds recently  She has snored loudly for few years Denies any witnessed apneas No gasping respirations at night No family history of obstructive sleep apnea  Denies morning headaches No dryness of the mouth in the morning Memory is good Concentration is good  She is tired during the day but does not take naps   Outpatient Encounter Medications as of 05/07/2020  Medication Sig  . ACCU-CHEK FASTCLIX LANCETS MISC Check sugar twice daily.  Marland Kitchen albuterol (PROVENTIL HFA;VENTOLIN HFA) 108 (90 BASE) MCG/ACT inhaler Inhale 2 puffs into the lungs every 6 (six) hours as needed for wheezing or shortness of breath.  Marland Kitchen aspirin EC 81 MG tablet Take 81 mg by mouth daily.  . cetirizine (ZYRTEC ALLERGY) 10 MG tablet Take 1 tablet (10 mg total) by mouth daily.  Marland Kitchen ezetimibe (ZETIA) 10 MG tablet Take 1 tablet (10 mg total) by mouth daily.  . fluticasone (FLONASE) 50 MCG/ACT nasal spray Place 2 sprays into both nostrils daily.  . Fluticasone-Salmeterol (ADVAIR) 100-50 MCG/DOSE AEPB Inhale 1 puff into the lungs 2 (two) times daily.  Marland Kitchen gabapentin (NEURONTIN) 300 MG capsule One caps by mouth every morning and 2 caps at bedtime.  . Glucose Blood (BLOOD GLUCOSE TEST STRIPS) STRP Use to check blood sugar twice a day. DX 250.00  . Insulin Glargine (LANTUS SOLOSTAR) 100 UNIT/ML Solostar Pen Inject 20 units SQ QHS (Patient taking differently:  Inject 24 units SQ QHS)  . Insulin Pen Needle 31G X 5 MM MISC Use 4x a day  . lisinopril (PRINIVIL,ZESTRIL) 10 MG tablet Take 1 tablet (10 mg total) by mouth daily.  Marland Kitchen omeprazole (PRILOSEC) 40 MG capsule TAKE ONE CAPSULE BY MOUTH DAILY  . rosuvastatin (CRESTOR) 40 MG tablet Take 1 tablet (40 mg total) by mouth daily.  . valACYclovir (VALTREX) 1000 MG tablet Take 1 tablet (1,000 mg total) by mouth daily.  Marland Kitchen HYDROcodone-acetaminophen (NORCO/VICODIN) 5-325 MG tablet Take 1-2 tablets by mouth every 6 (six) hours as needed. (Patient not taking: Reported on 05/07/2020)  . Liraglutide (VICTOZA) 18 MG/3ML SOPN Inject 1.2 mg into the skin daily. (Patient not taking: Reported on 05/07/2020)  . metFORMIN (GLUCOPHAGE-XR) 500 MG 24 hr tablet Take 2 tablets with breakfast and 2 with dinner. (Patient not taking: Reported on 05/07/2020)  . zolpidem (AMBIEN) 5 MG tablet Take 1 tablet (5 mg total) by mouth at bedtime as needed. (Patient not taking: Reported on 05/07/2020)   No facility-administered encounter medications on file as of 05/07/2020.    Allergies as of 05/07/2020  . (No Known Allergies)    Past Medical History:  Diagnosis Date  . Cholelithiases 01/25/2014  . Diabetes mellitus without complication ([facility])   . Fatty liver 01/25/2014  . History of herpes genitalis   . HTN (hypertension)   . Hypertension     Past Surgical History:  Procedure Laterality Date  . PARTIAL HYSTERECTOMY    . REDUCTION MAMMAPLASTY      Family History  Problem Relation Age of Onset  . Hypertension Mother   . Diabetes Father   . Hypertension Sister   . Cancer Maternal Aunt        ovarian  . Cancer Maternal Uncle        lung  . Diabetes Maternal Grandmother   . Diabetes Maternal Grandfather   . Diabetes Cousin   . Heart disease Neg Hx     Social History   Socioeconomic History  . Marital status: Single    Spouse name: Not on file  . Number of children: 3  . Years of education: Not on file  . Highest  education level: Not on file  Occupational History    Employer: [employer]  Tobacco Use  . Smoking status: Never Smoker  . Smokeless tobacco: Never Used  Substance and Sexual Activity  . Alcohol use: Not Currently    Comment: social  . Drug use: Not Currently  . Sexual activity: Not on file  Other Topics Concern  . Not on file  Social History Narrative   Single   3 children- ages 59 (son), 11 (daughter) and 25 (son)   Younger two children at home   She is a med Best boy at [employer]    Completed 1 year of college   Enjoys movies, relaxing, English as a second language teacher   Social Determinants of Corporate investment banker Strain:   . Difficulty of Paying Living Expenses:   Food Insecurity:   . Worried About Programme researcher, broadcasting/film/video in the Last Year:   . Barista in the Last Year:   Transportation Needs:   . Freight forwarder (Medical):   Marland Kitchen Lack of Transportation (Non-Medical):   Physical Activity:   . Days of Exercise per Week:   . Minutes of Exercise per Session:   Stress:   . Feeling of Stress :   Social Connections:   . Frequency of Communication with Friends and Family:   . Frequency of Social Gatherings with Friends and Family:   . Attends Religious Services:   . Active Member of Clubs or Organizations:   . Attends Banker Meetings:   Marland Kitchen Marital Status:   Intimate Partner Violence:   . Fear of Current or Ex-Partner:   . Emotionally Abused:   Marland Kitchen Physically Abused:   . Sexually Abused:     Review of Systems  Constitutional: Negative.   Respiratory: Negative.   Cardiovascular: Negative.   Psychiatric/Behavioral: Positive for sleep disturbance.    Vitals:   05/07/20 1554  BP: 122/76  Pulse: 83  Resp: 16  Temp: 98.2 F (36.8 C)  SpO2: 100%     Physical Exam Constitutional:      General: She is not in acute distress.    Appearance: She is not ill-appearing.  HENT:     Head: Normocephalic.     Nose: Nose normal.     Mouth/Throat:     Pharynx: No  oropharyngeal exudate.  Eyes:     General:        Right eye: No discharge.        Left eye: No discharge.  Cardiovascular:     Rate and Rhythm: Tachycardia present. Rhythm irregular.     Pulses: Normal pulses.     Heart sounds: No murmur heard.  No friction rub.  Pulmonary:     Effort: Pulmonary  effort is normal. No respiratory distress.     Breath sounds: Normal breath sounds. No stridor. No wheezing or rhonchi.  Musculoskeletal:     Cervical back: No rigidity or tenderness.  Neurological:     General: No focal deficit present.     Mental Status: She is alert.  Psychiatric:        Mood and Affect: Mood normal.    Results of the Epworth flowsheet 05/07/2020  Sitting and reading 1  Watching TV 0  Sitting, inactive in a public place (e.g. a theatre or a meeting) 0  As a passenger in a car for an hour without a break 1  Lying down to rest in the afternoon when circumstances permit 1  Sitting and talking to someone 0  Sitting quietly after a lunch without alcohol 0  In a car, while stopped for a few minutes in traffic 0  Total score 3    Assessment:  Moderate probability of significant obstructive sleep apnea  Significant snoring  Pathophysiology of sleep disordered breathing discussed with the patient Treatment options for sleep disordered breathing discussed with the patient  Plan/Recommendations: Risk of not treating sleep disordered breathing discussed  We will schedule the patient for home sleep study  Follow-up tentatively in about 3 months  Encouraged to call with any significant concerns   Virl Diamond MD [facility] 05/07/2020, 4:15 PM  CC: Verlon Au, MD

## 2020-05-07 NOTE — Patient Instructions (Signed)
Moderate probability of significant obstructive sleep apnea  We will schedule you for home sleep study Update your results as soon as reviewed  Treatment options as we discussed  Tentative follow-up appointment in about 3 months Call with significant concerns Sleep Apnea Sleep apnea is a condition in which breathing pauses or becomes shallow during sleep. Episodes of sleep apnea usually last 10 seconds or longer, and they may occur as many as 20 times an hour. Sleep apnea disrupts your sleep and keeps your body from getting the rest that it needs. This condition can increase your risk of certain health problems, including:  Heart attack.  Stroke.  Obesity.  Diabetes.  Heart failure.  Irregular heartbeat. What are the causes? There are three kinds of sleep apnea:  Obstructive sleep apnea. This kind is caused by a blocked or collapsed airway.  Central sleep apnea. This kind happens when the part of the brain that controls breathing does not send the correct signals to the muscles that control breathing.  Mixed sleep apnea. This is a combination of obstructive and central sleep apnea. The most common cause of this condition is a collapsed or blocked airway. An airway can collapse or become blocked if:  Your throat muscles are abnormally relaxed.  Your tongue and tonsils are larger than normal.  You are overweight.  Your airway is smaller than normal. What increases the risk? You are more likely to develop this condition if you:  Are overweight.  Smoke.  Have a smaller than normal airway.  Are elderly.  Are female.  Drink alcohol.  Take sedatives or tranquilizers.  Have a family history of sleep apnea. What are the signs or symptoms? Symptoms of this condition include:  Trouble staying asleep.  Daytime sleepiness and tiredness.  Irritability.  Loud snoring.  Morning headaches.  Trouble concentrating.  Forgetfulness.  Decreased interest in  sex.  Unexplained sleepiness.  Mood swings.  Personality changes.  Feelings of depression.  Waking up often during the night to urinate.  Dry mouth.  Sore throat. How is this diagnosed? This condition may be diagnosed with:  A medical history.  A physical exam.  A series of tests that are done while you are sleeping (sleep study). These tests are usually done in a sleep lab, but they may also be done at home. How is this treated? Treatment for this condition aims to restore normal breathing and to ease symptoms during sleep. It may involve managing health issues that can affect breathing, such as high blood pressure or obesity. Treatment may include:  Sleeping on your side.  Using a decongestant if you have nasal congestion.  Avoiding the use of depressants, including alcohol, sedatives, and narcotics.  Losing weight if you are overweight.  Making changes to your diet.  Quitting smoking.  Using a device to open your airway while you sleep, such as: ? An oral appliance. This is a custom-made mouthpiece that shifts your lower jaw forward. ? A continuous positive airway pressure (CPAP) device. This device blows air through a mask when you breathe out (exhale). ? A nasal expiratory positive airway pressure (EPAP) device. This device has valves that you put into each nostril. ? A bi-level positive airway pressure (BPAP) device. This device blows air through a mask when you breathe in (inhale) and breathe out (exhale).  Having surgery if other treatments do not work. During surgery, excess tissue is removed to create a wider airway. It is important to get treatment for sleep apnea. Without  treatment, this condition can lead to:  High blood pressure.  Coronary artery disease.  In men, an inability to achieve or maintain an erection (impotence).  Reduced thinking abilities. Follow these instructions at home: Lifestyle  Make any lifestyle changes that your health care  provider recommends.  Eat a healthy, well-balanced diet.  Take steps to lose weight if you are overweight.  Avoid using depressants, including alcohol, sedatives, and narcotics.  Do not use any products that contain nicotine or tobacco, such as cigarettes, e-cigarettes, and chewing tobacco. If you need help quitting, ask your health care provider. General instructions  Take over-the-counter and prescription medicines only as told by your health care provider.  If you were given a device to open your airway while you sleep, use it only as told by your health care provider.  If you are having surgery, make sure to tell your health care provider you have sleep apnea. You may need to bring your device with you.  Keep all follow-up visits as told by your health care provider. This is important. Contact a health care provider if:  The device that you received to open your airway during sleep is uncomfortable or does not seem to be working.  Your symptoms do not improve.  Your symptoms get worse. Get help right away if:  You develop: ? Chest pain. ? Shortness of breath. ? Discomfort in your back, arms, or stomach.  You have: ? Trouble speaking. ? Weakness on one side of your body. ? Drooping in your face. These symptoms may represent a serious problem that is an emergency. Do not wait to see if the symptoms will go away. Get medical help right away. Call your local emergency services (911 in the [place] Do not drive yourself to the hospital. Summary  Sleep apnea is a condition in which breathing pauses or becomes shallow during sleep.  The most common cause is a collapsed or blocked airway.  The goal of treatment is to restore normal breathing and to ease symptoms during sleep. This information is not intended to replace advice given to you by your health care provider. Make sure you discuss any questions you have with your health care provider. Document Revised: 03/15/2019  Document Reviewed: 05/24/2018 Elsevier Patient Education  2020 [employer].

## 2020-06-13 ENCOUNTER — Other Ambulatory Visit: Payer: Self-pay

## 2020-06-13 ENCOUNTER — Ambulatory Visit: Payer: BLUE CROSS/BLUE SHIELD

## 2020-06-13 DIAGNOSIS — G4733 Obstructive sleep apnea (adult) (pediatric): Secondary | ICD-10-CM

## 2020-06-20 ENCOUNTER — Telehealth: Payer: Self-pay | Admitting: Pulmonary Disease

## 2020-06-20 DIAGNOSIS — G4733 Obstructive sleep apnea (adult) (pediatric): Secondary | ICD-10-CM

## 2020-06-20 NOTE — Telephone Encounter (Signed)
Call patient  Sleep study result  Date of study: 06/13/2020  Impression: Moderate obstructive sleep apnea Mild oxygen desaturations  Recommendation: Recommend CPAP therapy for moderate obstructive sleep apnea Auto titrating CPAP with pressure settings of 5-15 will be appropriate  Follow-up as previously scheduled

## 2020-06-20 NOTE — Telephone Encounter (Signed)
Called and left message for patient to return call.  

## 2020-06-21 NOTE — Telephone Encounter (Signed)
Called and spoke with patient about sleep study results from Dr. Wynona Neat. She would like to proceed with CPAP. Order has been placed and informed patient that she would hear from one of the local DME companies. She expressed understanding. Nothing further needed at this time.

## 2020-06-21 NOTE — Telephone Encounter (Signed)
Follow up recall placed for 3 months.

## 2020-06-21 NOTE — Telephone Encounter (Signed)
Called and left message for patient to return call.  

## 2020-07-14 ENCOUNTER — Emergency Department (HOSPITAL_COMMUNITY)
Admission: EM | Admit: 2020-07-14 | Discharge: 2020-07-14 | Disposition: A | Discharge status: Home / Self Care | Payer: BLUE CROSS/BLUE SHIELD | Attending: Emergency Medicine | Admitting: Emergency Medicine

## 2020-07-14 ENCOUNTER — Other Ambulatory Visit: Payer: Self-pay

## 2020-07-14 ENCOUNTER — Emergency Department (HOSPITAL_COMMUNITY): Payer: BLUE CROSS/BLUE SHIELD

## 2020-07-14 DIAGNOSIS — Z79899 Other long term (current) drug therapy: Secondary | ICD-10-CM | POA: Insufficient documentation

## 2020-07-14 DIAGNOSIS — I10 Essential (primary) hypertension: Secondary | ICD-10-CM | POA: Diagnosis not present

## 2020-07-14 DIAGNOSIS — R0602 Shortness of breath: Secondary | ICD-10-CM | POA: Diagnosis not present

## 2020-07-14 DIAGNOSIS — R079 Chest pain, unspecified: Secondary | ICD-10-CM | POA: Diagnosis present

## 2020-07-14 DIAGNOSIS — Z794 Long term (current) use of insulin: Secondary | ICD-10-CM | POA: Insufficient documentation

## 2020-07-14 DIAGNOSIS — E119 Type 2 diabetes mellitus without complications: Secondary | ICD-10-CM | POA: Insufficient documentation

## 2020-07-14 DIAGNOSIS — R0600 Dyspnea, unspecified: Secondary | ICD-10-CM

## 2020-07-14 DIAGNOSIS — Z7982 Long term (current) use of aspirin: Secondary | ICD-10-CM | POA: Insufficient documentation

## 2020-07-14 DIAGNOSIS — J45909 Unspecified asthma, uncomplicated: Secondary | ICD-10-CM | POA: Diagnosis not present

## 2020-07-14 DIAGNOSIS — Z7984 Long term (current) use of oral hypoglycemic drugs: Secondary | ICD-10-CM | POA: Insufficient documentation

## 2020-07-14 LAB — BASIC METABOLIC PANEL
Anion gap: 10 (ref 5–15)
BUN: 10 mg/dL (ref 6–20)
CO2: 22 mmol/L (ref 22–32)
Calcium: 9.7 mg/dL (ref 8.9–10.3)
Chloride: 105 mmol/L (ref 98–111)
Creatinine, Ser: 0.6 mg/dL (ref 0.44–1.00)
GFR calc Af Amer: >60 mL/min (ref >60)
GFR calc non Af Amer: >60 mL/min (ref >60)
Glucose, Bld: 164 mg/dL — ABNORMAL HIGH (ref 70–99)
Potassium: 3.4 mmol/L — ABNORMAL LOW (ref 3.5–5.1)
Sodium: 137 mmol/L (ref 135–145)

## 2020-07-14 LAB — CBC
HCT: 40.8 % (ref 36.0–46.0)
Hemoglobin: 13.9 g/dL (ref 12.0–15.0)
MCH: 31 pg (ref 26.0–34.0)
MCHC: 34.1 g/dL (ref 30.0–36.0)
MCV: 90.9 fL (ref 80.0–100.0)
Platelets: 296 10*3/uL (ref 150–400)
RBC: 4.49 MIL/uL (ref 3.87–5.11)
RDW: 12.6 % (ref 11.5–15.5)
WBC: 10.2 10*3/uL (ref 4.0–10.5)
nRBC: 0 % (ref 0.0–0.2)

## 2020-07-14 LAB — TROPONIN I (HIGH SENSITIVITY)
Troponin I (High Sensitivity): <2 ng/L (ref <18)
Troponin I (High Sensitivity): 2 ng/L (ref <18)

## 2020-07-14 LAB — D-DIMER, QUANTITATIVE: D-Dimer, Quant: <0.27 ug/mL-FEU (ref 0.00–0.50)

## 2020-07-14 MED ORDER — NITROGLYCERIN 0.4 MG SL SUBL: 0.4000 mg | SUBLINGUAL_TABLET | SUBLINGUAL | 0 refills | Status: AC | PRN

## 2020-07-14 MED ORDER — NITROGLYCERIN 0.4 MG SL SUBL
0.4000 mg | SUBLINGUAL_TABLET | SUBLINGUAL | Status: DC | PRN
  Administered 2020-07-14: 0.4 mg via SUBLINGUAL
  Filled 2020-07-14: qty 1

## 2020-07-14 NOTE — Discharge Instructions (Addendum)
Test showed no evidence of heart attack.  Prescription for nitroglycerin tablets.  Follow-up with your primary care doctor or return if worse.

## 2020-07-14 NOTE — ED Provider Notes (Addendum)
[facility]-EMERGENCY DEPT Provider Note   CSN: [id number] Arrival date & time: 07/14/20  1855     History Chief Complaint  Patient presents with  . Chest Pain  . Shortness of Breath    Breanna Avery is a 49 y.o. female.  Chief complaint chest pain and dyspnea approximately 45 minutes prior to visit.  Pain described as sharp with radiation to the neck.  Review of systems positive for dyspnea.  Cardiac risk factors include hypertension, hyperlipidemia, diabetes.  Father had an MI at "age 56".  Severity is moderate.  Patient was sitting when symptoms started.        Past Medical History:  Diagnosis Date  . Cholelithiases 01/25/2014  . Diabetes mellitus without complication (HCC)   . Fatty liver 01/25/2014  . History of herpes genitalis   . HTN (hypertension)   . Hypertension     Patient Active Problem List   Diagnosis Date Noted  . Asthma 05/10/2015  . Pain of upper abdomen 03/08/2015  . Flank pain 09/10/2014  . Fatty liver 01/25/2014  . Cholelithiases 01/25/2014  . Tinea pedis 01/23/2014  . Atypical chest pain 12/05/2013  . Irritability 10/23/2013  . Low back pain radiating to right leg 08/25/2013  . Pain in joint, ankle and foot 08/25/2013  . Skin tag 05/01/2013  . Routine general medical examination at a health care facility 04/11/2013  . Epigastric abdominal tenderness 04/11/2013  . Diabetes type 2, uncontrolled (HCC) 02/28/2013  . Genital herpes 02/28/2013  . Hyperlipidemia 02/28/2013  . Peripheral neuropathy 02/28/2013  . HTN (hypertension) 02/28/2013    Past Surgical History:  Procedure Laterality Date  . PARTIAL HYSTERECTOMY    . REDUCTION MAMMAPLASTY       OB History   No obstetric history on file.     Family History  Problem Relation Age of Onset  . Hypertension Mother   . Diabetes Father   . Hypertension Sister   . Cancer Maternal Aunt        ovarian  . Cancer Maternal Uncle        lung  . Diabetes Maternal  Grandmother   . Diabetes Maternal Grandfather   . Diabetes Cousin   . Heart disease Neg Hx     Social History   Tobacco Use  . Smoking status: Never Smoker  . Smokeless tobacco: Never Used  Substance Use Topics  . Alcohol use: Not Currently    Comment: social  . Drug use: Not Currently    Home Medications Prior to Admission medications   Medication Sig Start Date End Date Taking? Authorizing Provider  acetaminophen (TYLENOL) 500 MG tablet Take 500 mg by mouth every 6 (six) hours as needed for headache.   Yes [provider]  albuterol (PROVENTIL HFA;VENTOLIN HFA) 108 (90 BASE) MCG/ACT inhaler Inhale 2 puffs into the lungs every 6 (six) hours as needed for wheezing or shortness of breath. 05/10/15  Yes Sandford Craze, NP  aspirin EC 81 MG tablet Take 81 mg by mouth daily.   Yes [provider]  cetirizine (ZYRTEC ALLERGY) 10 MG tablet Take 1 tablet (10 mg total) by mouth daily. 06/28/15  Yes Palumbo, April, MD  fluticasone [facility]) 50 MCG/ACT nasal spray Place 2 sprays into both nostrils daily. 03/06/16  Yes Sandford Craze, NP  insulin aspart (NOVOLOG) 100 UNIT/ML injection Inject 6 Units into the skin 3 (three) times daily before meals.   Yes [provider]  lisinopril (ZESTRIL) 20 MG tablet Take 20 mg  by mouth daily.   Yes [provider]  omeprazole (PRILOSEC) 40 MG capsule TAKE ONE CAPSULE BY MOUTH DAILY Patient taking differently: Take 40 mg by mouth daily.  11/29/15  Yes Sandford Craze, NP  rosuvastatin (CRESTOR) 40 MG tablet Take 1 tablet (40 mg total) by mouth daily. 03/06/16  Yes Sandford Craze, NP  Semaglutide,0.25 or 0.5MG /DOS, (OZEMPIC, 0.25 OR 0.5 MG/DOSE,) 2 MG/1.5ML SOPN Inject 0.5 mg into the skin once a week. Saturday   Yes [provider]  valACYclovir (VALTREX) 1000 MG tablet Take 1 tablet (1,000 mg total) by mouth daily. Patient taking differently: Take 1,000 mg by mouth daily as needed (breakout).   02/07/16  Yes Sandford Craze, NP  ACCU-CHEK FASTCLIX LANCETS MISC Check sugar twice daily. 09/10/14   Sandford Craze, NP  ezetimibe (ZETIA) 10 MG tablet Take 1 tablet (10 mg total) by mouth daily. Patient not taking: Reported on 07/14/2020 05/10/15   Sandford Craze, NP  Fluticasone-Salmeterol (ADVAIR) 100-50 MCG/DOSE AEPB Inhale 1 puff into the lungs 2 (two) times daily. Patient not taking: Reported on 07/14/2020 05/10/15   Sandford Craze, NP  gabapentin (NEURONTIN) 300 MG capsule One caps by mouth every morning and 2 caps at bedtime. Patient taking differently: Take 300 mg by mouth 2 (two) times daily.  05/10/15   Sandford Craze, NP  Glucose Blood (BLOOD GLUCOSE TEST STRIPS) STRP Use to check blood sugar twice a day. DX 250.00 09/10/14   Sandford Craze, NP  HYDROcodone-acetaminophen (NORCO/VICODIN) 5-325 MG tablet Take 1-2 tablets by mouth every 6 (six) hours as needed. Patient not taking: Reported on 05/07/2020 12/11/19   Graciella Freer A, PA-C  Insulin Glargine (LANTUS SOLOSTAR) 100 UNIT/ML Solostar Pen Inject 20 units SQ QHS Patient not taking: Reported on 07/14/2020 09/17/14   Carlus Pavlov, MD  Insulin Pen Needle 31G X 5 MM MISC Use 4x a day 08/05/15   Sandford Craze, NP  lisinopril (PRINIVIL,ZESTRIL) 10 MG tablet Take 1 tablet (10 mg total) by mouth daily. Patient not taking: Reported on 07/14/2020 05/10/15   Sandford Craze, NP  metFORMIN (GLUCOPHAGE-XR) 500 MG 24 hr tablet Take 2 tablets with breakfast and 2 with dinner. Patient not taking: Reported on 05/07/2020 09/17/14   Carlus Pavlov, MD  nitroGLYCERIN (NITROSTAT) 0.4 MG SL tablet Place 1 tablet (0.4 mg total) under the tongue every 5 (five) minutes as needed for chest pain. 07/14/20   Donnetta Hutching, MD  zolpidem (AMBIEN) 5 MG tablet Take 1 tablet (5 mg total) by mouth at bedtime as needed. Patient not taking: Reported on 05/07/2020 05/10/15   Sandford Craze, NP    Allergies    Cefdinir  Review  of Systems   Review of Systems  All other systems reviewed and are negative.   Physical Exam Updated Vital Signs BP 112/73   Pulse 89   Temp 97.8 F (36.6 C) (Oral)   Resp 14   Ht 5\' 1"  (1.549 m)   Wt 69.9 kg   SpO2 100%   BMI 29.10 kg/m   Physical Exam Vitals and nursing note reviewed.  Constitutional:      Appearance: She is well-developed.  HENT:     Head: Normocephalic and atraumatic.  Eyes:     Conjunctiva/sclera: Conjunctivae normal.  Cardiovascular:     Rate and Rhythm: Normal rate and regular rhythm.  Pulmonary:     Effort: Pulmonary effort is normal.     Breath sounds: Normal breath sounds.  Abdominal:     General: Bowel sounds are normal.  Palpations: Abdomen is soft.  Musculoskeletal:        General: Normal range of motion.     Cervical back: Neck supple.  Skin:    General: Skin is warm and dry.  Neurological:     General: No focal deficit present.     Mental Status: She is alert and oriented to person, place, and time.  Psychiatric:        Behavior: Behavior normal.     ED Results / Procedures / Treatments   Labs (all labs ordered are listed, but only abnormal results are displayed) Labs Reviewed  BASIC METABOLIC PANEL - Abnormal; Notable for the following components:      Result Value   Potassium 3.4 (*)    Glucose, Bld 164 (*)    All other components within normal limits  CBC  D-DIMER, QUANTITATIVE (NOT AT [facility])  TROPONIN I (HIGH SENSITIVITY)  TROPONIN I (HIGH SENSITIVITY)    EKG EKG Interpretation  Date/Time:  Sunday July 14 2020 18:57:38 EDT Ventricular Rate:  97 PR Interval:    QRS Duration: 78 QT Interval:  406 QTC Calculation: 516 R Axis:   37 Text Interpretation: Sinus rhythm Low voltage, precordial leads Borderline T abnormalities, inferior leads Prolonged QT interval 12 Lead; Mason-Likar Confirmed by Marylynne Keelin ([id number]) on 07/14/2020 10:57:15 PM   Radiology DG Chest 2 View  Result Date: 07/14/2020 CLINICAL DATA:   Chest pain and shortness of breath. EXAM: CHEST - 2 VIEW COMPARISON:  June 28, 2015 FINDINGS: The heart size and mediastinal contours are within normal limits. Both lungs are clear. The visualized skeletal structures are unremarkable. IMPRESSION: No active cardiopulmonary disease. Electronically Signed   By: Thaddeus  Houston M.D.   On: 07/14/2020 19:18    Procedures Procedures (including critical care time)  Medications Ordered in ED Medications  nitroGLYCERIN (NITROSTAT) SL tablet 0.4 mg (0.4 mg Sublingual Given 07/14/20 2006)    ED Course  I have reviewed the triage vital signs and the nursing notes.  Pertinent labs & imaging results that were available during my care of the patient were reviewed by me and considered in my medical decision making (see chart for details).    MDM Rules/Calculators/A&P                          We will obtain appropriate cardiac work-up.  Patient rechecked several times.  She feels much better.  EKG, chest x-ray, delta troponin all negative.  Discussed with patient.  Discharge medication nitroglycerin tablets.  Will follow up with primary care. Final Clinical Impression(s) / ED Diagnoses Final diagnoses:  Chest pain, unspecified type  Dyspnea, unspecified type    Rx / DC Orders ED Discharge Orders         Ordered    nitroGLYCERIN (NITROSTAT) 0.4 MG SL tablet  Every 5 min PRN        10 /03/21 2242           07-26-1988, MD 07/14/20 2135    2136, MD 07/14/20 2258

## 2020-07-14 NOTE — ED Triage Notes (Signed)
Patient presents to the ER for chest pain and SOB that started approximately 15 minutes ago. Patient denies N/V/D. Patient reports radiation to neck. Patient alert and oriented and able to speak in full sentences at this time

## 2020-07-29 ENCOUNTER — Telehealth: Payer: Self-pay | Admitting: Pulmonary Disease

## 2020-07-29 NOTE — Telephone Encounter (Signed)
I spoke with [facility] (rorech) and they tell me they have no cpaps available right now I will check and see if we can get this pt a cpap through another dme [facility]

## 2020-07-29 NOTE — Telephone Encounter (Signed)
I sent this order to [facility] and I received conformation it was received I will wait to hear from them to see if they can indeed take this order [facility] Unable to reach pt [facility]

## 2020-07-31 NOTE — Telephone Encounter (Signed)
Spoke to pt she is aware [facility] will be calling her Breanna Avery

## 2022-02-25 ENCOUNTER — Emergency Department (HOSPITAL_COMMUNITY): Payer: Self-pay

## 2022-02-25 ENCOUNTER — Encounter (HOSPITAL_COMMUNITY): Payer: Self-pay

## 2022-02-25 ENCOUNTER — Other Ambulatory Visit: Payer: Self-pay

## 2022-02-25 ENCOUNTER — Emergency Department (HOSPITAL_COMMUNITY)
Admission: EM | Admit: 2022-02-25 | Discharge: 2022-02-26 | Disposition: A | Discharge status: Home / Self Care | Payer: Self-pay | Attending: Emergency Medicine | Admitting: Emergency Medicine

## 2022-02-25 DIAGNOSIS — Z794 Long term (current) use of insulin: Secondary | ICD-10-CM | POA: Insufficient documentation

## 2022-02-25 DIAGNOSIS — R0789 Other chest pain: Secondary | ICD-10-CM | POA: Insufficient documentation

## 2022-02-25 DIAGNOSIS — R739 Hyperglycemia, unspecified: Secondary | ICD-10-CM

## 2022-02-25 DIAGNOSIS — E1165 Type 2 diabetes mellitus with hyperglycemia: Secondary | ICD-10-CM | POA: Insufficient documentation

## 2022-02-25 DIAGNOSIS — Z7984 Long term (current) use of oral hypoglycemic drugs: Secondary | ICD-10-CM | POA: Insufficient documentation

## 2022-02-25 DIAGNOSIS — Z7982 Long term (current) use of aspirin: Secondary | ICD-10-CM | POA: Insufficient documentation

## 2022-02-25 LAB — I-STAT BETA HCG BLOOD, ED (MC, WL, AP ONLY): I-stat hCG, quantitative: <5 m[IU]/mL (ref <5)

## 2022-02-25 LAB — BASIC METABOLIC PANEL
Anion gap: 9 (ref 5–15)
BUN: 14 mg/dL (ref 6–20)
CO2: 25 mmol/L (ref 22–32)
Calcium: 10 mg/dL (ref 8.9–10.3)
Chloride: 103 mmol/L (ref 98–111)
Creatinine, Ser: 0.73 mg/dL (ref 0.44–1.00)
GFR, Estimated: >60 mL/min (ref >60)
Glucose, Bld: 420 mg/dL — ABNORMAL HIGH (ref 70–99)
Potassium: 4.5 mmol/L (ref 3.5–5.1)
Sodium: 137 mmol/L (ref 135–145)

## 2022-02-25 LAB — CBC
HCT: 39.2 % (ref 36.0–46.0)
Hemoglobin: 13.7 g/dL (ref 12.0–15.0)
MCH: 32.2 pg (ref 26.0–34.0)
MCHC: 34.9 g/dL (ref 30.0–36.0)
MCV: 92 fL (ref 80.0–100.0)
Platelets: 267 10*3/uL (ref 150–400)
RBC: 4.26 MIL/uL (ref 3.87–5.11)
RDW: 13.1 % (ref 11.5–15.5)
WBC: 9.1 10*3/uL (ref 4.0–10.5)
nRBC: 0 % (ref 0.0–0.2)

## 2022-02-25 LAB — TROPONIN I (HIGH SENSITIVITY): Troponin I (High Sensitivity): 2 ng/L (ref <18)

## 2022-02-25 LAB — CBG MONITORING, ED: Glucose-Capillary: 401 mg/dL — ABNORMAL HIGH (ref 70–99)

## 2022-02-25 NOTE — ED Triage Notes (Signed)
Pt reports with hyperglycemia and chest pain x 2 weeks. Pt states that her chest pain feels like anxiety and also reports not having her diabetic medications.  ?

## 2022-02-26 LAB — TROPONIN I (HIGH SENSITIVITY): Troponin I (High Sensitivity): 2 ng/L (ref <18)

## 2022-02-26 LAB — CBG MONITORING, ED
Glucose-Capillary: 435 mg/dL — ABNORMAL HIGH (ref 70–99)
Glucose-Capillary: 408 mg/dL — ABNORMAL HIGH (ref 70–99)

## 2022-02-26 MED ORDER — INSULIN ASPART 100 UNIT/ML IJ SOLN
10.0000 [IU] | Freq: Once | INTRAMUSCULAR | Status: AC
  Administered 2022-02-26: 10 [IU] via SUBCUTANEOUS
  Filled 2022-02-26: qty 0.1

## 2022-02-26 MED ORDER — SODIUM CHLORIDE 0.9 % IV BOLUS
1000.0000 mL | Freq: Once | INTRAVENOUS | Status: AC
  Administered 2022-02-26: 1000 mL via INTRAVENOUS

## 2022-02-26 MED ORDER — METFORMIN HCL ER 500 MG PO TB24: 1000.0000 mg | ORAL_TABLET | Freq: Two times a day (BID) | ORAL | 3 refills | Status: AC

## 2022-02-26 MED ORDER — GLIPIZIDE 10 MG PO TABS: 10.0000 mg | ORAL_TABLET | Freq: Every day | ORAL | 0 refills | Status: AC

## 2022-02-26 NOTE — ED Provider Notes (Signed)
[facility]-EMERGENCY DEPT  Provider Note  CSN: [id number] Arrival date & time: 02/25/22 2140  History Chief Complaint  Patient presents with   Hyperglycemia   Chest Pain    Breanna Avery is a 51 y.o. female with history of DM has had trouble affording her medications due to lack of insurance. She has been taking her Metformin 1000mg  BID but unable to afford Jardiance. She has had elevated glucose readings at home as well as 'little SOB and chest pains' she attributes to anxiety from increased stress at work. She has obtained new insurance to start at the beginning of June. She was previously on insulin and Ozempic but not taking eithe rof those any more. Currently she is pain free. No other acute complaints.    Home Medications Prior to Admission medications   Medication Sig Start Date End Date Taking? Authorizing Provider  glipiZIDE (GLUCOTROL) 10 MG tablet Take 1 tablet (10 mg total) by mouth daily before breakfast. 02/26/22  Yes 02/28/22, MD  ACCU-CHEK FASTCLIX LANCETS MISC Check sugar twice daily. 09/10/14   09/12/14, NP  acetaminophen (TYLENOL) 500 MG tablet Take 500 mg by mouth every 6 (six) hours as needed for headache.    [provider]  albuterol (PROVENTIL HFA;VENTOLIN HFA) 108 (90 BASE) MCG/ACT inhaler Inhale 2 puffs into the lungs every 6 (six) hours as needed for wheezing or shortness of breath. 05/10/15   05/12/15, NP  aspirin EC 81 MG tablet Take 81 mg by mouth daily.    [provider]  cetirizine (ZYRTEC ALLERGY) 10 MG tablet Take 1 tablet (10 mg total) by mouth daily. 06/28/15   Palumbo, April, MD  fluticasone (FLONASE) 50 MCG/ACT nasal spray Place 2 sprays into both nostrils daily. 03/06/16   03/08/16, NP  gabapentin (NEURONTIN) 300 MG capsule One caps by mouth every morning and 2 caps at bedtime. Patient taking differently: Take 300 mg by mouth 2 (two) times daily.  05/10/15   05/12/15, NP  Glucose Blood (BLOOD GLUCOSE TEST STRIPS) STRP Use to check blood sugar twice a day. DX 250.00 09/10/14   09/12/14, NP  lisinopril (ZESTRIL) 20 MG tablet Take 20 mg by mouth daily.    [provider]  metFORMIN (GLUCOPHAGE-XR) 500 MG 24 hr tablet Take 2 tablets (1,000 mg total) by mouth 2 (two) times daily after a meal. 02/26/22   02/28/22, MD  nitroGLYCERIN (NITROSTAT) 0.4 MG SL tablet Place 1 tablet (0.4 mg total) under the tongue every 5 (five) minutes as needed for chest pain. 07/14/20   09/13/20, MD  omeprazole (PRILOSEC) 40 MG capsule TAKE ONE CAPSULE BY MOUTH DAILY Patient taking differently: Take 40 mg by mouth daily.  11/29/15   12/01/15, NP  rosuvastatin (CRESTOR) 40 MG tablet Take 1 tablet (40 mg total) by mouth daily. 03/06/16   03/08/16, NP  Semaglutide,0.25 or 0.5MG /DOS, (OZEMPIC, 0.25 OR 0.5 MG/DOSE,) 2 MG/1.5ML SOPN Inject 0.5 mg into the skin once a week. Saturday    [provider]  valACYclovir (VALTREX) 1000 MG tablet Take 1 tablet (1,000 mg total) by mouth daily. Patient taking differently: Take 1,000 mg by mouth daily as needed (breakout).  02/07/16   02/09/16, NP     Allergies    Cefdinir   Review of Systems   Review of Systems Please see HPI for pertinent positives and negatives  Physical Exam BP 122/90   Pulse 86   Temp  99 F (37.2 C) (Oral)   Resp 17   Ht 5\' 1"  (1.549 m)   Wt 65.8 kg   SpO2 95%   BMI 27.40 kg/m   Physical Exam Vitals and nursing note reviewed.  Constitutional:      Appearance: Normal appearance.  HENT:     Head: Normocephalic and atraumatic.     Nose: Nose normal.     Mouth/Throat:     Mouth: Mucous membranes are moist.  Eyes:     Extraocular Movements: Extraocular movements intact.     Conjunctiva/sclera: Conjunctivae normal.  Cardiovascular:     Rate and Rhythm: Normal rate.  Pulmonary:     Effort: Pulmonary effort is normal.     Breath  sounds: Normal breath sounds.  Abdominal:     General: Abdomen is flat.     Palpations: Abdomen is soft.     Tenderness: There is no abdominal tenderness.  Musculoskeletal:        General: No swelling. Normal range of motion.     Cervical back: Neck supple.  Skin:    General: Skin is warm and dry.  Neurological:     General: No focal deficit present.     Mental Status: She is alert.  Psychiatric:        Mood and Affect: Mood normal.    ED Results / Procedures / Treatments   EKG EKG Interpretation  Date/Time:  Wednesday Feb 25 2022 21:55:08 EDT Ventricular Rate:  96 PR Interval:  117 QRS Duration: 79 QT Interval:  366 QTC Calculation: 463 R Axis:   -44 Text Interpretation: Sinus rhythm Borderline short PR interval Left axis deviation Consider anterior infarct No significant change since last tracing Confirmed by Susy FrizzleSheldon, Ercie Eliasen [phone]([id number]) on 02/26/2022 2:07:10 AM  Procedures Procedures  Medications Ordered in the ED Medications  sodium chloride 0.9 % bolus 1,000 mL (0 mLs Intravenous Stopped 02/26/22 0403)  insulin aspart (novoLOG) injection 10 Units (10 Units Subcutaneous Given 02/26/22 0426)    Initial Impression and Plan  Patient here primarily for her hyperglycemia. Also having atypical chest pains. EKG in triage is unremarkable. Labs show normal CBC, Trop is neg. BMP with hyperglycemia, but no signs of DKA. Will give a liter of fluids and reassess glucose. Repeat trop to complete that workup. Will likely need a second diabetes medication pending follow up with her PCP next month.   ED Course   Clinical Course as of 02/26/22 0539  Thu Feb 26, 2022  0319 Repeat Trop remains normal. Will recheck CBG when IVF are done.  [CS]  0411 CBG has gone up after IVF. Will give a dose of SQ insulin and reassess.  [CS]  0532 Glucose remains elevated but improving and about at her recent baseline. Given her current lack of insurance, will try glipizide as a second agent for her as this  is available as a generic. She has had good control in the past with Jardiance and can hopefully be started back on that next month when her insurance starts. She was advised that glipizide can cause low sugars and to watch closely for symptoms of low sugar and to be sure to eat regularly.  [CS]    Clinical Course User Index [CS] Pollyann SavoySheldon, Makyia Erxleben B, MD     MDM Rules/Calculators/A&P Medical Decision Making Problems Addressed: Atypical chest pain: acute illness or injury Hyperglycemia: chronic illness or injury  Amount and/or Complexity of Data Reviewed Labs: ordered. Decision-making details documented in ED Course. Radiology: ordered and independent  interpretation performed. Decision-making details documented in ED Course.  Risk Prescription drug management.    Final Clinical Impression(s) / ED Diagnoses Final diagnoses:  Atypical chest pain  Hyperglycemia    Rx / DC Orders ED Discharge Orders          Ordered    metFORMIN (GLUCOPHAGE-XR) 500 MG 24 hr tablet  2 times daily after meals        02/26/22 0539    glipiZIDE (GLUCOTROL) 10 MG tablet  Daily before breakfast        02/26/22 0539             Pollyann Savoy, MD 02/26/22 [phone]
